# Patient Record
Sex: Male | Born: 1950 | Race: White | Hispanic: No | Marital: Single | Smoking: Current every day smoker
Health system: Southern US, Community
[De-identification: ages and names within clinical notes are randomized; demographics above are authoritative.]

---

## 2021-11-19 ENCOUNTER — Emergency Department: Payer: Medicare Other

## 2021-11-19 ENCOUNTER — Encounter: Payer: Self-pay | Admitting: Emergency Medicine

## 2021-11-19 ENCOUNTER — Other Ambulatory Visit: Payer: Self-pay

## 2021-11-19 ENCOUNTER — Inpatient Hospital Stay
Admission: EM | Admit: 2021-11-19 | Discharge: 2021-11-22 | DRG: 481 | Disposition: A | Discharge status: Skilled Nursing Facility | Payer: Medicare Other | Attending: Internal Medicine | Admitting: Internal Medicine

## 2021-11-19 DIAGNOSIS — F17210 Nicotine dependence, cigarettes, uncomplicated: Secondary | ICD-10-CM | POA: Diagnosis present

## 2021-11-19 DIAGNOSIS — Z01818 Encounter for other preprocedural examination: Secondary | ICD-10-CM | POA: Insufficient documentation

## 2021-11-19 DIAGNOSIS — Y93K1 Activity, walking an animal: Secondary | ICD-10-CM

## 2021-11-19 DIAGNOSIS — D62 Acute posthemorrhagic anemia: Secondary | ICD-10-CM

## 2021-11-19 DIAGNOSIS — W109XXA Fall (on) (from) unspecified stairs and steps, initial encounter: Secondary | ICD-10-CM | POA: Diagnosis present

## 2021-11-19 DIAGNOSIS — Z20822 Contact with and (suspected) exposure to covid-19: Secondary | ICD-10-CM | POA: Diagnosis present

## 2021-11-19 DIAGNOSIS — S72141A Displaced intertrochanteric fracture of right femur, initial encounter for closed fracture: Principal | ICD-10-CM | POA: Diagnosis present

## 2021-11-19 DIAGNOSIS — W19XXXA Unspecified fall, initial encounter: Secondary | ICD-10-CM | POA: Diagnosis not present

## 2021-11-19 DIAGNOSIS — Z419 Encounter for procedure for purposes other than remedying health state, unspecified: Secondary | ICD-10-CM

## 2021-11-19 DIAGNOSIS — F17200 Nicotine dependence, unspecified, uncomplicated: Secondary | ICD-10-CM | POA: Diagnosis not present

## 2021-11-19 DIAGNOSIS — J441 Chronic obstructive pulmonary disease with (acute) exacerbation: Secondary | ICD-10-CM | POA: Diagnosis present

## 2021-11-19 DIAGNOSIS — S72001A Fracture of unspecified part of neck of right femur, initial encounter for closed fracture: Secondary | ICD-10-CM | POA: Diagnosis present

## 2021-11-19 LAB — CBC WITH DIFFERENTIAL/PLATELET
Abs Immature Granulocytes: 0.04 10*3/uL (ref 0.00–0.07)
Basophils Absolute: 0 10*3/uL (ref 0.0–0.1)
Basophils Relative: 0 %
Eosinophils Absolute: 0.1 10*3/uL (ref 0.0–0.5)
Eosinophils Relative: 1 %
HCT: 36.1 % — ABNORMAL LOW (ref 39.0–52.0)
Hemoglobin: 11.9 g/dL — ABNORMAL LOW (ref 13.0–17.0)
Immature Granulocytes: 0 %
Lymphocytes Relative: 8 %
Lymphs Abs: 1.2 10*3/uL (ref 0.7–4.0)
MCH: 30.2 pg (ref 26.0–34.0)
MCHC: 33 g/dL (ref 30.0–36.0)
MCV: 91.6 fL (ref 80.0–100.0)
Monocytes Absolute: 0.8 10*3/uL (ref 0.1–1.0)
Monocytes Relative: 6 %
Neutro Abs: 12.2 10*3/uL — ABNORMAL HIGH (ref 1.7–7.7)
Neutrophils Relative %: 85 %
Platelets: 264 10*3/uL (ref 150–400)
RBC: 3.94 MIL/uL — ABNORMAL LOW (ref 4.22–5.81)
RDW: 14.6 % (ref 11.5–15.5)
WBC: 14.4 10*3/uL — ABNORMAL HIGH (ref 4.0–10.5)
nRBC: 0 % (ref 0.0–0.2)

## 2021-11-19 LAB — BASIC METABOLIC PANEL
Anion gap: 9 (ref 5–15)
BUN: 17 mg/dL (ref 8–23)
CO2: 27 mmol/L (ref 22–32)
Calcium: 8.4 mg/dL — ABNORMAL LOW (ref 8.9–10.3)
Chloride: 101 mmol/L (ref 98–111)
Creatinine, Ser: 0.71 mg/dL (ref 0.61–1.24)
GFR, Estimated: >60 mL/min (ref >60)
Glucose, Bld: 123 mg/dL — ABNORMAL HIGH (ref 70–99)
Potassium: 3.8 mmol/L (ref 3.5–5.1)
Sodium: 137 mmol/L (ref 135–145)

## 2021-11-19 LAB — TYPE AND SCREEN
ABO/RH(D): O NEG
Antibody Screen: NEGATIVE

## 2021-11-19 LAB — RESP PANEL BY RT-PCR (FLU A&B, COVID) ARPGX2
Influenza A by PCR: NEGATIVE
Influenza B by PCR: NEGATIVE
SARS Coronavirus 2 by RT PCR: NEGATIVE

## 2021-11-19 MED ORDER — IPRATROPIUM-ALBUTEROL 0.5-2.5 (3) MG/3ML IN SOLN
3.0000 mL | Freq: Once | RESPIRATORY_TRACT | Status: AC
  Administered 2021-11-19: 3 mL via RESPIRATORY_TRACT
  Filled 2021-11-19: qty 3

## 2021-11-19 MED ORDER — IPRATROPIUM-ALBUTEROL 0.5-2.5 (3) MG/3ML IN SOLN
3.0000 mL | Freq: Once | RESPIRATORY_TRACT | Status: AC
  Administered 2021-11-19: 3 mL via RESPIRATORY_TRACT

## 2021-11-19 MED ORDER — ALBUTEROL SULFATE (2.5 MG/3ML) 0.083% IN NEBU: 2.5000 mg | INHALATION_SOLUTION | RESPIRATORY_TRACT | Status: DC | PRN

## 2021-11-19 MED ORDER — MORPHINE SULFATE (PF) 2 MG/ML IV SOLN: 0.5000 mg | INTRAVENOUS | Status: DC | PRN

## 2021-11-19 MED ORDER — ONDANSETRON HCL 4 MG/2ML IJ SOLN
4.0000 mg | Freq: Once | INTRAMUSCULAR | Status: AC
  Administered 2021-11-19: 4 mg via INTRAVENOUS
  Filled 2021-11-19: qty 2

## 2021-11-19 MED ORDER — TRANEXAMIC ACID-NACL 1000-0.7 MG/100ML-% IV SOLN
1000.0000 mg | Freq: Once | INTRAVENOUS | Status: AC
  Administered 2021-11-19: 1000 mg via INTRAVENOUS
  Filled 2021-11-19: qty 100

## 2021-11-19 MED ORDER — IPRATROPIUM-ALBUTEROL 0.5-2.5 (3) MG/3ML IN SOLN
3.0000 mL | Freq: Once | RESPIRATORY_TRACT | Status: AC
  Administered 2021-11-19: 3 mL via RESPIRATORY_TRACT
  Filled 2021-11-19: qty 6

## 2021-11-19 MED ORDER — MORPHINE SULFATE (PF) 4 MG/ML IV SOLN
4.0000 mg | Freq: Once | INTRAVENOUS | Status: AC
  Administered 2021-11-19: 4 mg via INTRAVENOUS
  Filled 2021-11-19: qty 1

## 2021-11-19 MED ORDER — HYDROCODONE-ACETAMINOPHEN 5-325 MG PO TABS: 1.0000 | ORAL_TABLET | Freq: Four times a day (QID) | ORAL | Status: DC | PRN

## 2021-11-19 NOTE — ED Triage Notes (Addendum)
Pt coming from home via [facility]. Per EMS pt was walking his dog around 5:30 pm. Pt was walking back in his house and dog pulled him down the stairs. Pt states he fell off about 4-5 steps. Pt denies hitting his head. Pt is not taking any blood thinner.  ? ?Pt states he fell on right side of hip. Pt does have some abrasions on right forearm.  ? ?EMS gave pt 73mcg of fentanyl.  ?

## 2021-11-19 NOTE — Progress Notes (Signed)
Full consult note and discussion with patient to follow tomorrow AM.  Called by ED staff. Imaging reviewed.  - Plan for surgery tomorrow, likely afternoon.  - NPO after midnight - Hold anticoagulation - Admit to Hospitalist team.  

## 2021-11-19 NOTE — Assessment & Plan Note (Deleted)
Patient with history of nicotine dependence but no history of CAD, CHF, syncope, arrhythmias, stroke, OSA.  Denies history of COPD though appears to have a wheezing baseline.  Has good exercise tolerance ?EKG and chest x-ray nonacute ?Patient at low risk for perioperative cardiopulmonary complications ?Can proceed with proposed orthopedic repair ?Treat suspected COPD exacerbation to optimize pulmonary status prior to surgery ?

## 2021-11-19 NOTE — Assessment & Plan Note (Addendum)
No prior formal diagnosis of COPD, but had wheezing in ED. This has since resolved. Long term nicotine smoking of course predisposes to COPD, though no significant hyperinflation noted, in addition to no acute findings, on CXR.  ?- Continue inhaled bronchodilators. No indication for steroids currently. ?- Recommend formal PFTs after discharge. ?

## 2021-11-19 NOTE — Assessment & Plan Note (Addendum)
-   s/p IM intertrochanteric nail 3/6 by Dr. Allena Katz.  ?- Postoperative lovenox x4 weeks for VTE ppx, pain control as ordered.  ?- PT/OT formal evaluations > CSW consulted for SNF. ?

## 2021-11-19 NOTE — H&P (Signed)
?History and Physical  ? ? ?Patient: Darren Castillo PYK:[id number] DOB: [birth year] ?DOA: 11/19/2021 ?DOS: the patient was seen and examined on 11/19/2021 ?PCP: Pcp, No  ?Patient coming from: Home ? ?Chief Complaint:  ?Chief Complaint  ?Patient presents with  ? Fall  ? ? ?HPI: Darren Castillo is a 71 y.o. male with medical history significant for Nicotine dependence who presents to the ED following a mechanical fall while walking his dog onto his right hip when his dog suddenly ran off.  He experienced immediate pain in his right hip.  He did not hit his head nor did he lose consciousness.  He denies any other injury.  He was previously in his usual state of health ?ED course: Vitals within normal limits ?Blood work: WBC 14,000 with hemoglobin 11.9 ?COVID and flu negative ?EKG, personally viewed and interpreted: NSR at 87 with no acute ST-T wave changes ?Imaging: CT head and C-spine nonacute.  Hip x-ray with right intratrochanteric femoral fracture.  Chest x-ray nonacute ? ?Patient was noted to be wheezing in the ED which patient reports was his baseline.  He was administered a DuoNeb with improvement.  He denied history of COPD, asthma or inhaler use ? ?The ED provider spoke with orthopedist, Dr. Signa Kell who will take patient to the OR in the a.m.  Hospitalist consulted for admission and preoperative clearance.  ? ?Review of Systems: As mentioned in the history of present illness. All other systems reviewed and are negative. ?No past medical history on file. ?The histories are not reviewed yet. Please review them in the "History" navigator section and refresh this SmartLink. ?Social History:  reports that he has been smoking cigarettes. He has been smoking an average of .5 packs per day. He does not have any smokeless tobacco history on file. No history on file for alcohol use and drug use. ? ?Not on File ? ?No family history on file. ? ?Prior to Admission medications   ?Not on File  ? ? ?Physical Exam: ?Vitals:  ?  11/19/21 1957 11/19/21 2001 11/19/21 2003 11/19/21 2232  ?BP:  130/84  137/76  ?Pulse:  87  97  ?Resp:  20  14  ?Temp:  98 ?F (36.7 ?C)    ?TempSrc:  Oral    ?SpO2: 99% 99%  98%  ?Weight:   63.5 kg   ?Height:   5\' 6"  (1.676 m)   ? ?Physical Exam ?Vitals and nursing note reviewed.  ?Constitutional:   ?   General: He is not in acute distress. ?   Appearance: Normal appearance.  ?HENT:  ?   Head: Normocephalic and atraumatic.  ?Cardiovascular:  ?   Rate and Rhythm: Normal rate and regular rhythm.  ?   Pulses: Normal pulses.  ?   Heart sounds: Normal heart sounds. No murmur heard. ?Pulmonary:  ?   Effort: Pulmonary effort is normal.  ?   Breath sounds: Wheezing present. No rhonchi.  ?   Comments: Scattered wheezes ?Abdominal:  ?   General: Bowel sounds are normal.  ?   Palpations: Abdomen is soft.  ?   Tenderness: There is no abdominal tenderness.  ?Musculoskeletal:     ?   General: No swelling or tenderness. Normal range of motion.  ?   Cervical back: Normal range of motion and neck supple.  ?   Comments: Right hip shortening and external rotation  ?Skin: ?   General: Skin is warm and dry.  ?Neurological:  ?   General: No focal deficit  present.  ?   Mental Status: He is alert. Mental status is at baseline.  ?Psychiatric:     ?   Mood and Affect: Mood normal.     ?   Behavior: Behavior normal.  ? ? ? ?Data Reviewed: ?Relevant notes from primary care and specialist visits, past discharge summaries as available in EHR, including Care Everywhere. ?Prior diagnostic testing as pertinent to current admission diagnoses ?Updated medications and problem lists for reconciliation ?ED course, including vitals, labs, imaging, treatment and response to treatment ?Triage notes, nursing and pharmacy notes and ED provider's notes ?Notable results as noted in HPI ? ? ?Assessment and Plan: ?* Closed right hip fracture, initial encounter (HCC) ?Resulting from accidental fall ?Dr. Allena Katz will take patient to the OR in the a.m. ?N.p.o. from  midnight.  SCDs for DVT prophylaxis ?Further orders per Dr. Allena Katz ? ?COPD with acute exacerbation (HCC) ?No prior formal diagnosis of COPD, but patient wheezing on exam ?Mild exacerbation ?DuoNebs every 6 and as needed ? ?Preoperative clearance ?Patient with history of nicotine dependence but no history of CAD, CHF, syncope, arrhythmias, stroke, OSA.  Denies history of COPD though appears to have a wheezing baseline.  Has good exercise tolerance ?EKG and chest x-ray nonacute ?Patient at low risk for perioperative cardiopulmonary complications ?Can proceed with proposed orthopedic repair ?Treat suspected COPD exacerbation to optimize pulmonary status prior to surgery ? ?Tobacco use disorder ?Declines nicotine patch ?Advised on quitting ? ? ? ? ? ? ?Advance Care Planning:   Code Status: Not on file full ? ?Consults: Orthopedics, Dr. Signa Kell ? ?Family Communication: none ? ?Severity of Illness: ?The appropriate patient status for this patient is INPATIENT. Inpatient status is judged to be reasonable and necessary in order to provide the required intensity of service to ensure the patient's safety. The patient's presenting symptoms, physical exam findings, and initial radiographic and laboratory data in the context of their chronic comorbidities is felt to place them at high risk for further clinical deterioration. Furthermore, it is not anticipated that the patient will be medically stable for discharge from the hospital within 2 midnights of admission.  ? ?* I certify that at the point of admission it is my clinical judgment that the patient will require inpatient hospital care spanning beyond 2 midnights from the point of admission due to high intensity of service, high risk for further deterioration and high frequency of surveillance required.* ? ?Author: ?Andris Baumann, MD ?11/19/2021 10:48 PM ? ?For on call review [email or web address].  ?

## 2021-11-19 NOTE — Assessment & Plan Note (Addendum)
-   Cessation counseling provided. 

## 2021-11-19 NOTE — ED Provider Notes (Signed)
? ?[facility] ?Provider Note ? ? ? Event Date/Time  ? First MD Initiated Contact with Patient 11/19/21 2014   ?  (approximate) ? ? ?History  ? ?Fall ? ? ?HPI ? ?Darren Castillo is a 71 y.o. male here with fall.  Patient states he lives alone.  States he was taking his dog and stairs today when the dog ran, causing a fall.  He landed onto his right hip.  Reports he has been unable to ambulate since then.  The pain is aching, throbbing, worse with any kind of movement.  No history of previous injuries here.  Other than that, patient denies any direct trauma.  He is not on blood thinners.  Denies any headache.  No focal numbness or weakness.  Denies any distal numbness or tingling of the leg. ?  ? ? ?Physical Exam  ? ?Triage Vital Signs: ?ED Triage Vitals  ?Enc Vitals Group  ?   BP 11/19/21 2001 130/84  ?   Pulse Rate 11/19/21 2001 87  ?   Resp 11/19/21 2001 20  ?   Temp 11/19/21 2001 98 ?F (36.7 ?C)  ?   Temp Source 11/19/21 2001 Oral  ?   SpO2 11/19/21 1957 99 %  ?   Weight 11/19/21 2003 140 lb (63.5 kg)  ?   Height 11/19/21 2003 5\' 6"  (1.676 m)  ?   Head Circumference --   ?   Peak Flow --   ?   Pain Score 11/19/21 2002 7  ?   Pain Loc --   ?   Pain Edu? --   ?   Excl. in [place]? --   ? ? ?Most recent vital signs: ?Vitals:  ? 11/20/21 0000 11/20/21 0100  ?BP: 129/83 112/69  ?Pulse: (!) 104 95  ?Resp: 20 15  ?Temp:    ?SpO2: 93% 91%  ? ? ? ?General: Awake, no distress.  ?CV:  Good peripheral perfusion.  ?Resp:  Normal effort.  ?Abd:  No distention.  ?Other:  Right lower extremity shortened and externally rotated.  Distal neurovasculature is intact. ? ? ?ED Results / Procedures / Treatments  ? ?Labs ?(all labs ordered are listed, but only abnormal results are displayed) ?Labs Reviewed  ?CBC WITH DIFFERENTIAL/PLATELET - Abnormal; Notable for the following components:  ?    Result Value  ? WBC 14.4 (*)   ? RBC 3.94 (*)   ? Hemoglobin 11.9 (*)   ? HCT 36.1 (*)   ? Neutro Abs 12.2 (*)   ? All other  components within normal limits  ?BASIC METABOLIC PANEL - Abnormal; Notable for the following components:  ? Glucose, Bld 123 (*)   ? Calcium 8.4 (*)   ? All other components within normal limits  ?RESP PANEL BY RT-PCR (FLU A&B, COVID) ARPGX2  ?HIV ANTIBODY (ROUTINE TESTING W REFLEX)  ?TYPE AND SCREEN  ? ? ? ?EKG ?Normal sinus rhythm, ventricular rate 87.  PR 122, QRS 72, QTc 423.  No acute ST elevations or depressions.  No EKG evidence of acute ischemia or infarct. ? ? ?RADIOLOGY ?Chest x-ray: No acute abnormality ?DG hip: Right intertrochanteric hip fracture ?CT head/C-spine: No acute intracranial abnormality ? ? ?I also independently reviewed and agree wit radiologist interpretations. ? ? ?PROCEDURES: ? ?Critical Care performed: No ? ? ? ?MEDICATIONS ORDERED IN ED: ?Medications  ?HYDROcodone-acetaminophen (NORCO/VICODIN) 5-325 MG per tablet 1-2 tablet (has no administration in time range)  ?morphine (PF) 2 MG/ML injection 0.5 mg (has no  administration in time range)  ?albuterol (PROVENTIL) (2.5 MG/3ML) 0.083% nebulizer solution 2.5 mg (has no administration in time range)  ?ipratropium-albuterol (DUONEB) 0.5-2.5 (3) MG/3ML nebulizer solution 3 mL (3 mLs Nebulization Not Given 11/20/21 0134)  ?ipratropium-albuterol (DUONEB) 0.5-2.5 (3) MG/3ML nebulizer solution 3 mL (3 mLs Nebulization Given 11/19/21 2042)  ?morphine (PF) 4 MG/ML injection 4 mg (4 mg Intravenous Given 11/19/21 2042)  ?ondansetron [facility]) injection 4 mg (4 mg Intravenous Given 11/19/21 2042)  ?tranexamic acid (CYKLOKAPRON) IVPB 1,000 mg (0 mg Intravenous Stopped 11/19/21 2302)  ?ipratropium-albuterol (DUONEB) 0.5-2.5 (3) MG/3ML nebulizer solution 3 mL (3 mLs Nebulization Given 11/19/21 2303)  ?ipratropium-albuterol (DUONEB) 0.5-2.5 (3) MG/3ML nebulizer solution 3 mL (3 mLs Nebulization Given 11/19/21 2303)  ? ? ? ?IMPRESSION / MDM / ASSESSMENT AND PLAN / ED COURSE  ?I reviewed the triage vital signs and the nursing notes. ?             ?               ? ? ?The  patient is on the cardiac monitor to evaluate for evidence of arrhythmia and/or significant heart rate changes. ? ? ?MDM:  ?71 yo M with no reported PMHx here with hip pain after mechanical fall. Re: fall - imaging shows R intratroch fx. Dr. Posey Pronto with Ortho consulted and will see pt. CT Head/ C spine negative. Pt states he was otherwise well, denies recent illness. He has a significant wheeze on exam, however, and reports a long h/o smoking. Suspect underlying COPD. Breathing tx given. Admit to medicine.  ? ? ?MEDICATIONS GIVEN IN ED: ?Medications  ?HYDROcodone-acetaminophen (NORCO/VICODIN) 5-325 MG per tablet 1-2 tablet (has no administration in time range)  ?morphine (PF) 2 MG/ML injection 0.5 mg (has no administration in time range)  ?albuterol (PROVENTIL) (2.5 MG/3ML) 0.083% nebulizer solution 2.5 mg (has no administration in time range)  ?ipratropium-albuterol (DUONEB) 0.5-2.5 (3) MG/3ML nebulizer solution 3 mL (3 mLs Nebulization Not Given 11/20/21 0134)  ?ipratropium-albuterol (DUONEB) 0.5-2.5 (3) MG/3ML nebulizer solution 3 mL (3 mLs Nebulization Given 11/19/21 2042)  ?morphine (PF) 4 MG/ML injection 4 mg (4 mg Intravenous Given 11/19/21 2042)  ?ondansetron [facility]) injection 4 mg (4 mg Intravenous Given 11/19/21 2042)  ?tranexamic acid (CYKLOKAPRON) IVPB 1,000 mg (0 mg Intravenous Stopped 11/19/21 2302)  ?ipratropium-albuterol (DUONEB) 0.5-2.5 (3) MG/3ML nebulizer solution 3 mL (3 mLs Nebulization Given 11/19/21 2303)  ?ipratropium-albuterol (DUONEB) 0.5-2.5 (3) MG/3ML nebulizer solution 3 mL (3 mLs Nebulization Given 11/19/21 2303)  ? ? ? ?Consults:  ?Dr. Posey Pronto, orthopedics ? ? ?EMR reviewed  ?None available ? ? ? ? ?FINAL CLINICAL IMPRESSION(S) / ED DIAGNOSES  ? ?Final diagnoses:  ?Closed fracture of right hip, initial encounter ([place])  ? ? ? ?Rx / DC Orders  ? ?ED Discharge Orders   ? ? None  ? ?  ? ? ? ?Note:  This document was prepared using Dragon voice recognition software and may include unintentional dictation  errors. ?  ?Duffy Bruce, MD ?11/20/21 0143 ? ?

## 2021-11-20 ENCOUNTER — Other Ambulatory Visit: Payer: Self-pay

## 2021-11-20 ENCOUNTER — Encounter
Admission: EM | Disposition: A | Discharge status: Skilled Nursing Facility | Payer: Self-pay | Source: Home / Self Care | Attending: Family Medicine

## 2021-11-20 ENCOUNTER — Inpatient Hospital Stay: Payer: Medicare Other

## 2021-11-20 ENCOUNTER — Encounter: Payer: Self-pay | Admitting: Internal Medicine

## 2021-11-20 ENCOUNTER — Inpatient Hospital Stay: Payer: Medicare Other | Admitting: Anesthesiology

## 2021-11-20 DIAGNOSIS — F17200 Nicotine dependence, unspecified, uncomplicated: Secondary | ICD-10-CM

## 2021-11-20 DIAGNOSIS — W19XXXA Unspecified fall, initial encounter: Secondary | ICD-10-CM

## 2021-11-20 DIAGNOSIS — J441 Chronic obstructive pulmonary disease with (acute) exacerbation: Secondary | ICD-10-CM

## 2021-11-20 HISTORY — PX: INTRAMEDULLARY (IM) NAIL INTERTROCHANTERIC: SHX5875

## 2021-11-20 SURGERY — FIXATION, FRACTURE, INTERTROCHANTERIC, WITH INTRAMEDULLARY ROD
Anesthesia: Spinal | Site: Hip | Laterality: Right

## 2021-11-20 MED ORDER — TRANEXAMIC ACID-NACL 1000-0.7 MG/100ML-% IV SOLN
INTRAVENOUS | Status: DC | PRN
  Administered 2021-11-20: 1000 mg via INTRAVENOUS

## 2021-11-20 MED ORDER — ONDANSETRON HCL 4 MG PO TABS: 4.0000 mg | ORAL_TABLET | Freq: Four times a day (QID) | ORAL | Status: DC | PRN

## 2021-11-20 MED ORDER — BUPIVACAINE HCL (PF) 0.5 % IJ SOLN
INTRAMUSCULAR | Status: DC | PRN
  Administered 2021-11-20: 2.5 mL

## 2021-11-20 MED ORDER — ENSURE ENLIVE PO LIQD
237.0000 mL | Freq: Two times a day (BID) | ORAL | Status: DC
  Filled 2021-11-20 (×4): qty 237

## 2021-11-20 MED ORDER — FENTANYL CITRATE (PF) 100 MCG/2ML IJ SOLN
INTRAMUSCULAR | Status: DC | PRN
  Administered 2021-11-20 (×2): 50 ug via INTRAVENOUS

## 2021-11-20 MED ORDER — METHOCARBAMOL 1000 MG/10ML IJ SOLN
500.0000 mg | Freq: Four times a day (QID) | INTRAVENOUS | Status: DC | PRN
  Filled 2021-11-20: qty 5

## 2021-11-20 MED ORDER — FLEET ENEMA 7-19 GM/118ML RE ENEM: 1.0000 | ENEMA | Freq: Once | RECTAL | Status: DC | PRN

## 2021-11-20 MED ORDER — TRAMADOL HCL 50 MG PO TABS: 50.0000 mg | ORAL_TABLET | Freq: Four times a day (QID) | ORAL | Status: DC | PRN

## 2021-11-20 MED ORDER — BUPIVACAINE HCL (PF) 0.5 % IJ SOLN
INTRAMUSCULAR | Status: AC
  Filled 2021-11-20: qty 30

## 2021-11-20 MED ORDER — ACETAMINOPHEN 10 MG/ML IV SOLN
INTRAVENOUS | Status: DC | PRN
  Administered 2021-11-20: 1000 mg via INTRAVENOUS

## 2021-11-20 MED ORDER — ADULT MULTIVITAMIN W/MINERALS CH
1.0000 | ORAL_TABLET | Freq: Every day | ORAL | Status: DC
  Administered 2021-11-21 – 2021-11-22 (×2): 1 via ORAL
  Filled 2021-11-20 (×2): qty 1

## 2021-11-20 MED ORDER — OXYCODONE HCL 5 MG PO TABS
5.0000 mg | ORAL_TABLET | ORAL | Status: DC | PRN
  Administered 2021-11-21: 5 mg via ORAL
  Filled 2021-11-20: qty 1

## 2021-11-20 MED ORDER — FENTANYL CITRATE (PF) 100 MCG/2ML IJ SOLN
INTRAMUSCULAR | Status: AC
  Filled 2021-11-20: qty 2

## 2021-11-20 MED ORDER — DOCUSATE SODIUM 100 MG PO CAPS
100.0000 mg | ORAL_CAPSULE | Freq: Two times a day (BID) | ORAL | Status: DC
  Administered 2021-11-20 – 2021-11-22 (×4): 100 mg via ORAL
  Filled 2021-11-20 (×4): qty 1

## 2021-11-20 MED ORDER — NEOMYCIN-POLYMYXIN B GU 40-200000 IR SOLN
Status: AC
  Filled 2021-11-20: qty 4

## 2021-11-20 MED ORDER — SODIUM CHLORIDE 0.9 % IV SOLN: INTRAVENOUS | Status: DC

## 2021-11-20 MED ORDER — OXYCODONE HCL 5 MG PO TABS: 2.5000 mg | ORAL_TABLET | ORAL | Status: DC | PRN

## 2021-11-20 MED ORDER — PHENYLEPHRINE HCL-NACL 20-0.9 MG/250ML-% IV SOLN
INTRAVENOUS | Status: AC
  Filled 2021-11-20: qty 250

## 2021-11-20 MED ORDER — LACTATED RINGERS IV SOLN
Freq: Once | INTRAVENOUS | Status: AC
Start: 2021-11-20 — End: 2021-11-20

## 2021-11-20 MED ORDER — CEFAZOLIN SODIUM-DEXTROSE 2-4 GM/100ML-% IV SOLN
2.0000 g | Freq: Three times a day (TID) | INTRAVENOUS | Status: AC
  Administered 2021-11-20 – 2021-11-21 (×3): 2 g via INTRAVENOUS
  Filled 2021-11-20 (×3): qty 100

## 2021-11-20 MED ORDER — LACTATED RINGERS IV SOLN: INTRAVENOUS | Status: DC | PRN

## 2021-11-20 MED ORDER — METOCLOPRAMIDE HCL 10 MG PO TABS: 5.0000 mg | ORAL_TABLET | Freq: Three times a day (TID) | ORAL | Status: DC | PRN

## 2021-11-20 MED ORDER — LIDOCAINE HCL (PF) 2 % IJ SOLN
INTRAMUSCULAR | Status: AC
  Filled 2021-11-20: qty 5

## 2021-11-20 MED ORDER — FENTANYL CITRATE (PF) 100 MCG/2ML IJ SOLN: 25.0000 ug | INTRAMUSCULAR | Status: DC | PRN

## 2021-11-20 MED ORDER — NEOMYCIN-POLYMYXIN B GU 40-200000 IR SOLN
Status: DC | PRN
  Administered 2021-11-20: 4 mL

## 2021-11-20 MED ORDER — KETOROLAC TROMETHAMINE 15 MG/ML IJ SOLN
7.5000 mg | Freq: Four times a day (QID) | INTRAMUSCULAR | Status: AC
  Administered 2021-11-20 – 2021-11-21 (×4): 7.5 mg via INTRAVENOUS
  Filled 2021-11-20 (×4): qty 1

## 2021-11-20 MED ORDER — METHOCARBAMOL 500 MG PO TABS: 500.0000 mg | ORAL_TABLET | Freq: Four times a day (QID) | ORAL | Status: DC | PRN

## 2021-11-20 MED ORDER — BUPIVACAINE IN DEXTROSE 0.75-8.25 % IT SOLN: INTRATHECAL | Status: DC | PRN

## 2021-11-20 MED ORDER — SENNOSIDES-DOCUSATE SODIUM 8.6-50 MG PO TABS: 1.0000 | ORAL_TABLET | Freq: Every evening | ORAL | Status: DC | PRN

## 2021-11-20 MED ORDER — PROPOFOL 500 MG/50ML IV EMUL
INTRAVENOUS | Status: DC | PRN
  Administered 2021-11-20: 50 ug/kg/min via INTRAVENOUS

## 2021-11-20 MED ORDER — CEFAZOLIN SODIUM-DEXTROSE 2-4 GM/100ML-% IV SOLN
INTRAVENOUS | Status: AC
  Filled 2021-11-20: qty 100

## 2021-11-20 MED ORDER — IPRATROPIUM-ALBUTEROL 0.5-2.5 (3) MG/3ML IN SOLN
3.0000 mL | Freq: Four times a day (QID) | RESPIRATORY_TRACT | Status: DC
  Administered 2021-11-20 (×2): 3 mL via RESPIRATORY_TRACT
  Filled 2021-11-20 (×3): qty 3

## 2021-11-20 MED ORDER — BUPIVACAINE LIPOSOME 1.3 % IJ SUSP
INTRAMUSCULAR | Status: DC | PRN
  Administered 2021-11-20: 20 mL

## 2021-11-20 MED ORDER — BUPIVACAINE HCL (PF) 0.5 % IJ SOLN
INTRAMUSCULAR | Status: DC | PRN
  Administered 2021-11-20: 30 mL

## 2021-11-20 MED ORDER — PHENYLEPHRINE HCL-NACL 20-0.9 MG/250ML-% IV SOLN
INTRAVENOUS | Status: DC | PRN
  Administered 2021-11-20: 35 ug/min via INTRAVENOUS

## 2021-11-20 MED ORDER — PHENYLEPHRINE 40 MCG/ML (10ML) SYRINGE FOR IV PUSH (FOR BLOOD PRESSURE SUPPORT)
PREFILLED_SYRINGE | INTRAVENOUS | Status: AC
Start: 2021-11-20 — End: ?
  Filled 2021-11-20: qty 10

## 2021-11-20 MED ORDER — IPRATROPIUM-ALBUTEROL 0.5-2.5 (3) MG/3ML IN SOLN
3.0000 mL | Freq: Three times a day (TID) | RESPIRATORY_TRACT | Status: DC
  Administered 2021-11-21: 3 mL via RESPIRATORY_TRACT
  Filled 2021-11-20: qty 3

## 2021-11-20 MED ORDER — ACETAMINOPHEN 500 MG PO TABS
1000.0000 mg | ORAL_TABLET | Freq: Three times a day (TID) | ORAL | Status: DC
  Administered 2021-11-20 – 2021-11-22 (×5): 1000 mg via ORAL
  Filled 2021-11-20 (×5): qty 2

## 2021-11-20 MED ORDER — PROPOFOL 500 MG/50ML IV EMUL
INTRAVENOUS | Status: AC
Start: 2021-11-20 — End: ?
  Filled 2021-11-20: qty 50

## 2021-11-20 MED ORDER — TRANEXAMIC ACID-NACL 1000-0.7 MG/100ML-% IV SOLN
INTRAVENOUS | Status: AC
  Filled 2021-11-20: qty 100

## 2021-11-20 MED ORDER — METOCLOPRAMIDE HCL 5 MG/ML IJ SOLN: 5.0000 mg | Freq: Three times a day (TID) | INTRAMUSCULAR | Status: DC | PRN

## 2021-11-20 MED ORDER — PHENYLEPHRINE HCL (PRESSORS) 10 MG/ML IV SOLN
INTRAVENOUS | Status: DC | PRN
  Administered 2021-11-20 (×5): 80 ug via INTRAVENOUS

## 2021-11-20 MED ORDER — HYDROMORPHONE HCL 1 MG/ML IJ SOLN: 0.2000 mg | INTRAMUSCULAR | Status: DC | PRN

## 2021-11-20 MED ORDER — ENOXAPARIN SODIUM 40 MG/0.4ML IJ SOSY
40.0000 mg | PREFILLED_SYRINGE | INTRAMUSCULAR | Status: DC
  Administered 2021-11-21 – 2021-11-22 (×2): 40 mg via SUBCUTANEOUS
  Filled 2021-11-20 (×2): qty 0.4

## 2021-11-20 MED ORDER — ONDANSETRON HCL 4 MG/2ML IJ SOLN: 4.0000 mg | Freq: Four times a day (QID) | INTRAMUSCULAR | Status: DC | PRN

## 2021-11-20 MED ORDER — MUPIROCIN 2 % EX OINT
1.0000 "application " | TOPICAL_OINTMENT | Freq: Two times a day (BID) | CUTANEOUS | Status: DC
  Filled 2021-11-20: qty 22

## 2021-11-20 MED ORDER — CEFAZOLIN SODIUM-DEXTROSE 2-4 GM/100ML-% IV SOLN
2.0000 g | Freq: Once | INTRAVENOUS | Status: AC
  Administered 2021-11-20: 2 g via INTRAVENOUS

## 2021-11-20 MED ORDER — BUPIVACAINE LIPOSOME 1.3 % IJ SUSP
INTRAMUSCULAR | Status: AC
  Filled 2021-11-20: qty 20

## 2021-11-20 MED ORDER — ACETAMINOPHEN 10 MG/ML IV SOLN
INTRAVENOUS | Status: AC
  Filled 2021-11-20: qty 100

## 2021-11-20 MED ORDER — BISACODYL 10 MG RE SUPP: 10.0000 mg | Freq: Every day | RECTAL | Status: DC | PRN

## 2021-11-20 MED ORDER — 0.9 % SODIUM CHLORIDE (POUR BTL) OPTIME
TOPICAL | Status: DC | PRN
  Administered 2021-11-20: 1000 mL

## 2021-11-20 SURGICAL SUPPLY — 46 items
BIT DRILL INTERTAN LAG SCREW (BIT) ×2 IMPLANT
BIT DRILL LONG 4.0 (BIT) IMPLANT
BLADE SURG 15 STRL LF DISP TIS (BLADE) ×1 IMPLANT
BLADE SURG 15 STRL SS (BLADE) ×2
CHLORAPREP W/TINT 26 (MISCELLANEOUS) ×3 IMPLANT
DRAPE 3/4 80X56 (DRAPES) ×3 IMPLANT
DRAPE SURG 17X11 SM STRL (DRAPES) ×6 IMPLANT
DRAPE U-SHAPE 47X51 STRL (DRAPES) ×6 IMPLANT
DRILL BIT LONG 4.0 (BIT) ×3
DRSG OPSITE POSTOP 3X4 (GAUZE/BANDAGES/DRESSINGS) ×9 IMPLANT
ELECT REM PT RETURN 9FT ADLT (ELECTROSURGICAL) ×3
ELECTRODE REM PT RTRN 9FT ADLT (ELECTROSURGICAL) ×1 IMPLANT
GLOVE SRG 8 PF TXTR STRL LF DI (GLOVE) ×1 IMPLANT
GLOVE SURG SYN 7.5  E (GLOVE) ×2
GLOVE SURG SYN 7.5 E (GLOVE) ×1 IMPLANT
GLOVE SURG SYN 7.5 PF PI (GLOVE) ×1 IMPLANT
GLOVE SURG UNDER POLY LF SZ8 (GLOVE) ×2
GOWN STRL REUS W/ TWL LRG LVL3 (GOWN DISPOSABLE) ×1 IMPLANT
GOWN STRL REUS W/ TWL XL LVL3 (GOWN DISPOSABLE) ×1 IMPLANT
GOWN STRL REUS W/TWL LRG LVL3 (GOWN DISPOSABLE) ×2
GOWN STRL REUS W/TWL XL LVL3 (GOWN DISPOSABLE) ×2
GUIDE PIN 3.2X343 (PIN) ×2
GUIDE PIN 3.2X343MM (PIN) ×4
KIT PATIENT CARE HANA TABLE (KITS) ×3 IMPLANT
KIT TURNOVER KIT A (KITS) ×3 IMPLANT
MANIFOLD NEPTUNE II (INSTRUMENTS) ×3 IMPLANT
MAT ABSORB  FLUID 56X50 GRAY (MISCELLANEOUS) ×4
MAT ABSORB FLUID 56X50 GRAY (MISCELLANEOUS) ×2 IMPLANT
NAIL TRIGEN INTERTAN 10X18CM (Nail) ×2 IMPLANT
NDL FILTER BLUNT 18X1 1/2 (NEEDLE) ×1 IMPLANT
NEEDLE FILTER BLUNT 18X 1/2SAF (NEEDLE) ×2
NEEDLE FILTER BLUNT 18X1 1/2 (NEEDLE) ×1 IMPLANT
NEEDLE HYPO 22GX1.5 SAFETY (NEEDLE) ×3 IMPLANT
NS IRRIG 1000ML POUR BTL (IV SOLUTION) ×3 IMPLANT
PACK HIP COMPR (MISCELLANEOUS) ×3 IMPLANT
PENCIL ELECTRO HAND CTR (MISCELLANEOUS) ×3 IMPLANT
PIN GUIDE 3.2X343MM (PIN) IMPLANT
SCREW LAG COMPR KIT 95/90 (Screw) ×2 IMPLANT
SCREW TRIGEN LOW PROF 5.0X35 (Screw) ×2 IMPLANT
STAPLER SKIN PROX 35W (STAPLE) ×3 IMPLANT
SUT VIC AB 0 CT1 36 (SUTURE) ×2 IMPLANT
SUT VIC AB 2-0 CT2 27 (SUTURE) ×3 IMPLANT
SYR 10ML LL (SYRINGE) ×3 IMPLANT
SYR 30ML LL (SYRINGE) ×3 IMPLANT
TAPE CLOTH 3X10 WHT NS LF (GAUZE/BANDAGES/DRESSINGS) ×6 IMPLANT
WATER STERILE IRR 500ML POUR (IV SOLUTION) ×3 IMPLANT

## 2021-11-20 NOTE — Op Note (Signed)
DATE OF SURGERY: 11/20/2021 ? ?PREOPERATIVE DIAGNOSIS: Right intertrochanteric hip fracture ? ?POSTOPERATIVE DIAGNOSIS: Right intertrochanteric hip fracture ? ?PROCEDURE: Intramedullary nailing of Right femur with cephalomedullary device ? ?SURGEON: Rosealee Albee, MD ? ?ANESTHESIA: spinal ? ?EBL: 50 cc ? ?IVF: per anesthesia record ? ?COMPONENTS:  ?[employer] Trigen Intertan Short Nail: 10x182mm; lag screw with 77mm compression screw; 5x 48mm distal cortical interlocking screw ? ?INDICATIONS: ?Darren Castillo is a 71 y.o. male who sustained an intertrochanteric fracture after a fall. Risks and benefits of intramedullary nailing were explained to the patient and/or family . Risks include but are not limited to bleeding, infection, injury to tissues, nerves, vessels, nonunion/malunion, hardware failure, limb length discrepancy/hip rotation mismatch and risks of anesthesia. The patient and/or family understand these risks, have completed an informed consent, and wish to proceed. ?  ?PROCEDURE:  ?The patient was brought into the operating room. After administering anesthesia, the patient was placed in the supine position on the Hana table. The uninjured leg was placed in an extended position while the injured lower extremity was placed in longitudinal traction. The fracture was reduced using longitudinal traction and internal rotation. The adequacy of reduction was verified fluoroscopically in AP and lateral projections and found to be almost anatomic. The lateral aspect of the hip and thigh were prepped with ChloraPrep solution before being draped sterilely. Preoperative IV antibiotics were administered. A timeout was performed to verify the appropriate surgical site, patient, and procedure.  ?  ?The greater trochanter was identified and an approximately 6 cm incision was made about 3 fingerbreadths above the tip of the greater trochanter. The incision was carried down through the subcutaneous tissues to expose  the gluteal fascia. This was split the length of the incision, providing access to the tip of the trochanter. Under fluoroscopic guidance, a guidewire was drilled through the tip of the trochanter into the proximal metaphysis to the level of the lesser trochanter. After verifying its position fluoroscopically in AP and lateral projections, it was overreamed with the opening reamer to the level of the lesser trochanter. The nail was selected and advanced to the appropriate depth as verified fluoroscopically.  ?  ?The guide system for the lag screw was positioned and advanced through an approximately 5cm incision over the lateral aspect of the proximal femur. The guidewire was drilled up through the femoral nail and into the femoral neck to rest within 5 mm of subchondral bone. After verifying its position in the femoral neck and head in both AP and lateral projections, the guidewire was measured and appropriate sized lag screw was selected.  The channel for the compression screw was drilled and antirotation bar was placed.  Lag screw was drilled and placed in appropriate position.  Compression screw was then placed.  Appropriate compression was achieved.  The set screw was locked in place. Again, the adequacy of hardware position and fracture reduction was verified fluoroscopically in AP and lateral projections. ?  ?Attention was then turned to the distal interlocking screw in the diaphysis. Using a targeted assembly, a stab incision was made and hole was drilled through the nail. An interlocking screw was placed with excellent purchase.  Appropriate screw position was verified fluoroscopically in AP and lateral projections. ?  ?The wounds were irrigated thoroughly with sterile saline solution. Local anesthetic was injected into the wounds. Deep fascia was closed with 0-Vicryl. The subcutaneous tissues were closed using 2-0 Vicryl interrupted sutures. The skin was closed using staples. Sterile occlusive dressings  were applied to all wounds. The patient was then transferred to the recovery room in satisfactory condition. ?  ?POSTOPERATIVE PLAN: ?The patient will be WBAT on the operative extremity. Lovenox 40mg /day x 4 weeks to start on POD#1. Perioperative IV antibiotics x 24 hours. PT/OT on POD#1.  ? ?

## 2021-11-20 NOTE — Progress Notes (Signed)
Bladder scanned for 228cc

## 2021-11-20 NOTE — Transfer of Care (Signed)
Immediate Anesthesia Transfer of Care Note ? ?Patient: Darren Castillo ? ?Procedure(s) Performed: INTRAMEDULLARY (IM) NAIL INTERTROCHANTRIC (Right: Hip) ? ?Patient Location: PACU ? ?Anesthesia Type:Spinal ? ?Level of Consciousness: awake, drowsy and patient cooperative ? ?Airway & Oxygen Therapy: Patient Spontanous Breathing ? ?Post-op Assessment: Report given to RN and Post -op Vital signs reviewed and stable ? ?Post vital signs: Reviewed and stable ? ?Last Vitals:  ?Vitals Value Taken Time  ?BP    ?Temp    ?Pulse    ?Resp    ?SpO2    ? ? ?Last Pain:  ?Vitals:  ? 11/20/21 1245  ?TempSrc: Temporal  ?PainSc: 4   ?   ? ?Patients Stated Pain Goal: 0 (11/20/21 1245) ? ?Complications: No notable events documented. ?

## 2021-11-20 NOTE — Anesthesia Procedure Notes (Addendum)
Spinal ? ?Patient location during procedure: OR ?Start time: 11/20/2021 1:29 PM ?End time: 11/20/2021 1:35 PM ?Reason for block: surgical anesthesia ?Staffing ?Performed: resident/CRNA  ?Resident/CRNA: Demetrius Charity, CRNA ?Preanesthetic Checklist ?Completed: patient identified, IV checked, site marked, risks and benefits discussed, surgical consent, monitors and equipment checked, pre-op evaluation and timeout performed ?Spinal Block ?Patient position: left lateral decubitus ?Prep: ChloraPrep ?Patient monitoring: heart rate, continuous pulse ox, blood pressure and cardiac monitor ?Approach: midline ?Location: L3-4 ?Injection technique: single-shot ?Needle ?Needle type: Whitacre and Introducer  ?Needle gauge: 25 G ?Needle length: 9 cm ?Assessment ?Sensory level: T10 ?Events: CSF return ?Additional Notes ?Sterile aseptic technique used throughout the procedure.  Negative paresthesia. Negative blood return. Positive free-flowing CSF. Expiration date of kit checked and confirmed. Patient tolerated procedure well, without complications. ? ? ? ? ? ?

## 2021-11-20 NOTE — Progress Notes (Signed)
Arrived to unit from PACU, bedside handoff completed with [facility].  ?

## 2021-11-20 NOTE — Plan of Care (Signed)

## 2021-11-20 NOTE — Progress Notes (Signed)
PT Cancellation Note ? ?Patient Details ?Name: Renn Dirocco ?MRN: [id number] ?DOB: 17-Oct-1950 ? ? ?Cancelled Treatment:    Reason Eval/Treat Not Completed: Medical issues which prohibited therapy (Consult received and chart reviewed. Patient currently off unit for surgical repair of acute hip fracture.  Will require new orders post-procedure. Please re-consult as medically appropiate.) ? ?Verity Gilcrest H. Manson Passey, PT, DPT, NCS ?11/20/21, 1:28 PM ?[phone] ? ?

## 2021-11-20 NOTE — Consult Note (Signed)
ORTHOPAEDIC CONSULTATION  REQUESTING PHYSICIAN: Patrecia Pour, MD  Chief Complaint:   R hip pain  History of Present Illness: Darren Castillo is a 71 y.o. male who had a fall at home after his dog ran off.  The patient noted immediate hip pain and inability to ambulate.  The patient ambulates unassisted at baseline.  The patient lives at home by himself. Pain is described as sharp at its worst and a dull ache at its best.  Pain is rated a 10 out of 10 in severity.  Pain is improved with rest and immobilization.  Pain is worse with any sort of movement.  X-rays in the emergency department show a right intertrochanteric hip fracture.  He denies any significant medical history as he has not seen a doctor in many years. No anticoagulation. He states he smokes ~1/3ppd cigarettes.   History reviewed. No pertinent past medical history. History reviewed. No pertinent surgical history. Social History   Socioeconomic History   Marital status: Single    Spouse name: Not on file   Number of children: Not on file   Years of education: Not on file   Highest education level: Not on file  Occupational History   Not on file  Tobacco Use   Smoking status: Every Day    Packs/day: 0.50    Types: Cigarettes   Smokeless tobacco: Not on file  Substance and Sexual Activity   Alcohol use: Not on file   Drug use: Not on file   Sexual activity: Not on file  Other Topics Concern   Not on file  Social History Narrative   Not on file   Social Determinants of Health   Financial Resource Strain: Not on file  Food Insecurity: Not on file  Transportation Needs: Not on file  Physical Activity: Not on file  Stress: Not on file  Social Connections: Not on file   History reviewed. No pertinent family history. No Known Allergies Prior to Admission medications   Medication Sig Start Date End Date Taking? Authorizing Provider  metoprolol  tartrate (LOPRESSOR) 25 MG tablet Take 12.5 mg by mouth 2 (two) times daily. Patient not taking: Reported on 11/20/2021 03/17/18   [provider]   Recent Labs    11/19/21 2008  WBC 14.4*  HGB 11.9*  HCT 36.1*  PLT 264  K 3.8  CL 101  CO2 27  BUN 17  CREATININE 0.71  GLUCOSE 123*  CALCIUM 8.4*   CT HEAD WO CONTRAST (5MM)  Result Date: 11/19/2021 CLINICAL DATA:  Head trauma, moderate-severe Got pulled down stairs by dog, fall down steps. EXAM: CT HEAD WITHOUT CONTRAST TECHNIQUE: Contiguous axial images were obtained from the base of the skull through the vertex without intravenous contrast. RADIATION DOSE REDUCTION: This exam was performed according to the departmental dose-optimization program which includes automated exposure control, adjustment of the mA and/or kV according to patient size and/or use of iterative reconstruction technique. COMPARISON:  None. FINDINGS: Brain: No intracranial hemorrhage, mass effect, or midline shift. Age related atrophy. No hydrocephalus. The basilar cisterns are patent. There is moderate periventricular and deep white matter hypodensity typical of chronic small vessel ischemia. 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 No evidence of territorial infarct or acute ischemia. No extra-axial or intracranial fluid collection. Vascular: Atherosclerosis of skullbase vasculature without hyperdense vessel or abnormal calcification. Skull: No fracture or focal lesion. Sinuses/Orbits: Paranasal sinuses and mastoid air cells are clear. The visualized orbits are unremarkable. Other:  None. IMPRESSION: 1. No acute intracranial abnormality. No skull fracture. 2. Age related atrophy and chronic small vessel ischemia. 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 Electronically Signed   By: Keith Rake M.D.   On: 11/19/2021 21:26   CT Cervical Spine Wo Contrast  Result Date: 11/19/2021 CLINICAL DATA:  Neck  trauma (Age >= 65y) Pulled down stairs by dog, fall down steps. EXAM: CT CERVICAL SPINE WITHOUT CONTRAST TECHNIQUE: Multidetector CT imaging of the cervical spine was performed without intravenous contrast. Multiplanar CT image reconstructions were also generated. RADIATION DOSE REDUCTION: This exam was performed according to the departmental dose-optimization program which includes automated exposure control, adjustment of the mA and/or kV according to patient size and/or use of iterative reconstruction technique. COMPARISON:  None. FINDINGS: Alignment: No traumatic subluxation. There is trace retrolisthesis of C5 on C6 that is degenerative. Skull base and vertebrae: No acute fracture. Vertebral body heights are maintained. The dens and skull base are intact. Small benign appearing lucency within C6 vertebral body. Soft tissues and spinal canal: No prevertebral fluid or swelling. No visible canal hematoma. Disc levels: Diffuse degenerative disc disease with multilevel facet hypertrophy. There is multilevel neural foraminal narrowing. Mild spinal canal narrowing at C5-C6. Upper chest: Emphysema.  No acute findings. Other: Carotid calcifications. IMPRESSION: Multilevel degenerative disc disease and facet hypertrophy. No acute fracture or traumatic subluxation. Electronically Signed   By: Keith Rake M.D.   On: 11/19/2021 21:41   DG Chest Portable 1 View  Result Date: 11/19/2021 CLINICAL DATA:  Recent fall while walking dog with chest pain, initial encounter EXAM: PORTABLE CHEST 1 VIEW COMPARISON:  None. FINDINGS: Cardiac shadow is within normal limits. The lungs are well aerated bilaterally. No focal infiltrate or effusion is seen. No pneumothorax is noted. No discrete rib fracture is noted. Old healed rib fracture is noted on the right. Mild deformity of the proximal right humerus is noted likely related to prior fracture and healing. IMPRESSION: No acute abnormality noted. Electronically Signed   By: Inez Catalina M.D.   On: 11/19/2021 21:13   DG Hip Unilat W or Wo Pelvis 2-3 Views Right  Result Date: 11/19/2021 CLINICAL DATA:  Recent fall while walking dog with right hip pain, initial encounter EXAM: DG HIP (WITH OR WITHOUT PELVIS) 3V RIGHT COMPARISON:  None. FINDINGS: Comminuted intratrochanteric fracture is noted with impaction and angulation at the fracture site. Femoral head is well seated. The pelvic ring appears intact. Degenerative changes of the left hip joint and lumbar spine are noted. No soft tissue abnormality is seen. IMPRESSION: Right intratrochanteric femoral fracture. Electronically Signed   By: Inez Catalina M.D.   On: 11/19/2021 21:14     Positive ROS: All other systems have been reviewed and were otherwise negative with the exception of those mentioned in the HPI and as above.  Physical Exam: BP 106/62    Pulse (!) 107    Temp 98.2 F (36.8 C) (Oral)    Resp 20    Ht 5\' 6"  (1.676 m)    Wt 63.5 kg    SpO2 100%    BMI 22.60 kg/m  General:  Alert, no acute distress Psychiatric:  Patient is competent for consent with normal mood and affect   Cardiovascular:  No pedal edema, regular rate and rhythm Respiratory:  No wheezing, non-labored breathing GI:  Abdomen is soft and non-tender Skin:  No lesions in the area of chief complaint, no erythema Neurologic:  Sensation intact distally,  CN grossly intact Lymphatic:  No axillary or cervical lymphadenopathy  Orthopedic Exam:  RLE: 5/5 DF/PF/EHL SILT s/s/t/sp/dp distr Foot wwp +Log roll/axial load   X-rays:  As above: R intertrochanteric hip fracture  Assessment/Plan: Darren Castillo is a 71 y.o. male with a R intertrochanteric hip fracture   1. I discussed the various treatment options including both surgical and non-surgical management of the fracture with the patient. We discussed the high risk of perioperative complications due to patient's age, likely COPD, and smoking status. We also discussed smoking cessation. After  discussion of risks, benefits, and alternatives to surgery, the patient was in agreement to proceed with surgery.  The goals of surgery would be to provide adequate pain relief and allow for mobilization. Plan for surgery is R hip cephalomedullary nailing today, 11/20/2021. 2. NPO until OR 3. Hold anticoagulation in advance of OR     Leim Fabry   11/20/2021 7:39 AM

## 2021-11-20 NOTE — Anesthesia Preprocedure Evaluation (Signed)
Anesthesia Evaluation  ?Patient identified by MRN, date of birth, ID band ?Patient awake ? ? ? ?Reviewed: ?Allergy & Precautions, H&P , NPO status , Patient's Chart, lab work & pertinent test results, reviewed documented beta blocker date and time  ? ?History of Anesthesia Complications ?Negative for: history of anesthetic complications ? ?Airway ?Mallampati: II ? ?TM Distance: >3 FB ?Neck ROM: full ? ? ? Dental ?no notable dental hx. ?(+) Partial Upper, Missing, Dental Advidsory Given, Poor Dentition ?  ?Pulmonary ?neg shortness of breath, neg sleep apnea, COPD, neg recent URI, Current Smoker and Patient abstained from smoking.,  ?  ?Pulmonary exam normal ?breath sounds clear to auscultation ? ? ? ? ? ? Cardiovascular ?Exercise Tolerance: Good ?negative cardio ROS ?Normal cardiovascular exam ?Rhythm:regular Rate:Normal ? ? ?  ?Neuro/Psych ?negative neurological ROS ? negative psych ROS  ? GI/Hepatic ?negative GI ROS, Neg liver ROS,   ?Endo/Other  ?negative endocrine ROS ? Renal/GU ?negative Renal ROS  ?negative genitourinary ?  ?Musculoskeletal ? ? Abdominal ?  ?Peds ? Hematology ?negative hematology ROS ?(+)   ?Anesthesia Other Findings ?History reviewed. No pertinent past medical history. ? ? Reproductive/Obstetrics ?negative OB ROS ? ?  ? ? ? ? ? ? ? ? ? ? ? ? ? ?  ?  ? ? ? ? ? ? ? ? ?Anesthesia Physical ?Anesthesia Plan ? ?ASA: 2 ? ?Anesthesia Plan: Spinal  ? ?Post-op Pain Management:   ? ?Induction:  ? ?PONV Risk Score and Plan: 0 and Propofol infusion and TIVA ? ?Airway Management Planned: Natural Airway ? ?Additional Equipment:  ? ?Intra-op Plan:  ? ?Post-operative Plan:  ? ?Informed Consent: I have reviewed the patients History and Physical, chart, labs and discussed the procedure including the risks, benefits and alternatives for the proposed anesthesia with the patient or authorized representative who has indicated his/her understanding and acceptance.  ? ? ? ?Dental  Advisory Given ? ?Plan Discussed with: Anesthesiologist, CRNA and Surgeon ? ?Anesthesia Plan Comments:   ? ? ? ? ? ? ?Anesthesia Quick Evaluation ? ?

## 2021-11-20 NOTE — Progress Notes (Signed)
?Progress Note ? ?Patient: Darren Castillo JGG:[id number] DOB: 1951/02/28  ?DOA: 11/19/2021  DOS: 11/20/2021  ?  ?Brief hospital course: ?Darren Castillo is a 71 y.o. male with a history of tobacco use who presented to the ED 3/5 after being pulled down stairs while walking his dog, noting immediate right hip pain. In the ED VSS. Hgb 11.9g/dl, WBC 54Y, XR confirmed right intertrochanteric femoral fracture. He was given duonebs for wheezing and admitted, plans to undergo operative management of hip fracture 3/6. ? ?Assessment and Plan: ?* Closed right hip fracture, initial encounter (HCC) ?- To OR 3/6. ?- Postoperative pain control, VTE ppx, and weight-bearing status per orthopedics. ?- PT/OT to begin 3/7. ? ?COPD with acute exacerbation (HCC) ?No prior formal diagnosis of COPD, but had wheezing in ED. This has since resolved. Long term nicotine smoking of course predisposes to COPD, though no significant hyperinflation noted, in addition to no acute findings, on CXR.  ?- Continue inhaled bronchodilators. Hoping to avoid steroids to optimize wound healing. Currently stable enough to do so. ?- Recommend formal PFTs after discharge. ? ?Preoperative clearance ?Patient with history of nicotine dependence but no history of CAD, CHF, syncope, arrhythmias, stroke, OSA.  Denies history of COPD though appears to have a wheezing baseline.  Has good exercise tolerance ?EKG and chest x-ray nonacute ?Patient at low risk for perioperative cardiopulmonary complications ?Can proceed with proposed orthopedic repair ?Treat suspected COPD exacerbation to optimize pulmonary status prior to surgery ? ?Tobacco use disorder ?- Cessation counseling provided ? ?Subjective: Pain controlled as long as he's still. Moving causes constant severe pain in the right hip. ? ?Objective: ?Vitals:  ? 11/20/21 0600 11/20/21 0640 11/20/21 0900 11/20/21 1245  ?BP: 106/62  106/81 120/83  ?Pulse: 98 (!) 107 81 100  ?Resp: 20 20 19 16   ?Temp:   98 ?F (36.7 ?C) 98.6  ?F (37 ?C)  ?TempSrc:   Oral Temporal  ?SpO2: 93% 100% 90% 94%  ?Weight:    63.5 kg  ?Height:    5\' 6"  (1.676 m)  ? ?Gen: Pleasant, thin 71 y.o. male in no distress ?Pulm: Nonlabored breathing room air. No wheezes ?CV: Regular rate and rhythm. No murmur, rub, or gallop. No JVD, no dependent edema. ?GI: Abdomen soft, non-tender, non-distended, with normoactive bowel sounds.  ?Ext: Warm, RLE shortened, externally rotated without other deformities ?Skin: No rashes, lesions or ulcers on visualized skin. ?Neuro: Alert and oriented. No focal neurological deficits. ?Psych: Judgement and insight appear fair. Mood euthymic & affect congruent. Behavior is appropriate.   ? ?Data Personally reviewed: ?CBC: ?Recent Labs  ?Lab 11/19/21 ?2008  ?WBC 14.4*  ?NEUTROABS 12.2*  ?HGB 11.9*  ?HCT 36.1*  ?MCV 91.6  ?PLT 264  ? ?Basic Metabolic Panel: ?Recent Labs  ?Lab 11/19/21 ?2008  ?NA 137  ?K 3.8  ?CL 101  ?CO2 27  ?GLUCOSE 123*  ?BUN 17  ?CREATININE 0.71  ?CALCIUM 8.4*  ?   ?CT HEAD WO CONTRAST ( ) ? ?Result Date: 11/19/2021 ?CLINICAL DATA:  Head trauma, moderate-severe Got pulled down stairs by dog, fall down steps. EXAM: CT HEAD WITHOUT CONTRAST TECHNIQUE: Contiguous axial images were obtained from the base of the skull through the vertex without intravenous contrast. RADIATION DOSE REDUCTION: This exam was performed according to the departmental dose-optimization program which includes automated exposure control, adjustment of the mA and/or kV according to patient size and/or use of iterative reconstruction technique. COMPARISON:  None. FINDINGS: Brain: No intracranial hemorrhage, mass effect, or midline shift. Age related atrophy.  No hydrocephalus. The basilar cisterns are patent. There is moderate periventricular and deep white matter hypodensity typical of chronic small vessel ischemia. 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 No evidence of territorial infarct or acute ischemia. No extra-axial  or intracranial fluid collection. Vascular: Atherosclerosis of skullbase vasculature without hyperdense vessel or abnormal calcification. Skull: No fracture or focal lesion. Sinuses/Orbits: Paranasal sinuses and mastoid air cells are clear. The visualized orbits are unremarkable. Other: None. IMPRESSION: 1. No acute intracranial abnormality. No skull fracture. 2. Age related atrophy and chronic small vessel ischemia. 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 Electronically Signed   By: Narda Rutherford M.D.   On: 11/19/2021 21:26  ? ?CT Cervical Spine Wo Contrast ? ?Result Date: 11/19/2021 ?CLINICAL DATA:  Neck trauma (Age >= 65y) Pulled down stairs by dog, fall down steps. EXAM: CT CERVICAL SPINE WITHOUT CONTRAST TECHNIQUE: Multidetector CT imaging of the cervical spine was performed without intravenous contrast. Multiplanar CT image reconstructions were also generated. RADIATION DOSE REDUCTION: This exam was performed according to the departmental dose-optimization program which includes automated exposure control, adjustment of the mA and/or kV according to patient size and/or use of iterative reconstruction technique. COMPARISON:  None. FINDINGS: Alignment: No traumatic subluxation. There is trace retrolisthesis of C5 on C6 that is degenerative. Skull base and vertebrae: No acute fracture. Vertebral body heights are maintained. The dens and skull base are intact. Small benign appearing lucency within C6 vertebral body. Soft tissues and spinal canal: No prevertebral fluid or swelling. No visible canal hematoma. Disc levels: Diffuse degenerative disc disease with multilevel facet hypertrophy. There is multilevel neural foraminal narrowing. Mild spinal canal narrowing at C5-C6. Upper chest: Emphysema.  No acute findings. Other: Carotid calcifications. IMPRESSION: Multilevel degenerative disc disease and facet hypertrophy. No acute fracture or traumatic subluxation. Electronically Signed    By: Narda Rutherford M.D.   On: 11/19/2021 21:41  ? ?DG Chest Portable 1 View ? ?Result Date: 11/19/2021 ?CLINICAL DATA:  Recent fall while walking dog with chest pain, initial encounter EXAM: PORTABLE CHEST 1 VIEW COMPARISON:  None. FINDINGS: Cardiac shadow is within normal limits. The lungs are well aerated bilaterally. No focal infiltrate or effusion is seen. No pneumothorax is noted. No discrete rib fracture is noted. Old healed rib fracture is noted on the right. Mild deformity of the proximal right humerus is noted likely related to prior fracture and healing. IMPRESSION: No acute abnormality noted. Electronically Signed   By: Alcide Clever M.D.   On: 11/19/2021 21:13  ? ?DG C-Arm 1-60 Min-No Report ? ?Result Date: 11/20/2021 ?Fluoroscopy was utilized by the requesting physician.  No radiographic interpretation.  ? ?DG Hip Unilat W or Wo Pelvis 2-3 Views Right ? ?Result Date: 11/19/2021 ?CLINICAL DATA:  Recent fall while walking dog with right hip pain, initial encounter EXAM: DG HIP (WITH OR WITHOUT PELVIS) 3V RIGHT COMPARISON:  None. FINDINGS: Comminuted intratrochanteric fracture is noted with impaction and angulation at the fracture site. Femoral head is well seated. The pelvic ring appears intact. Degenerative changes of the left hip joint and lumbar spine are noted. No soft tissue abnormality is seen. IMPRESSION: Right intratrochanteric femoral fracture. Electronically Signed   By: Alcide Clever M.D.   On: 11/19/2021 21:14   ? ?SARS-CoV-2 PCR (3/5): Negative ?Influenza A/B: Negative ? ?Family Communication: None at bedside ? ?Disposition: ?Status is: Inpatient ?Remains inpatient appropriate because: Operative management of hip fracture.  ?Planned Discharge Destination:  Pending postoperative  therapy evaluations, anticipate SNF. ? ?Tyrone Nine, MD ?11/20/2021 2:31 PM ?Page by [email or web address]  ?

## 2021-11-20 NOTE — Progress Notes (Signed)
OT Cancellation Note ? ?Patient Details ?Name: Darren Castillo ?MRN: [id number] ?DOB: 1950/11/15 ? ? ?Cancelled Treatment:    Reason Eval/Treat Not Completed: Patient not medically ready. Per chart, plan for surgery today. Will await new OT orders to evaluate following surgery.  ? ?Arman Filter., MPH, MS, OTR/L ?ascom [phone] ?11/20/21, 7:41 AM ?

## 2021-11-20 NOTE — Progress Notes (Signed)
Initial Nutrition Assessment ? ?DOCUMENTATION CODES:  ? ?Not applicable ? ?INTERVENTION:  ? ?-Once diet is advanced add: ? ?-Ensure Enlive po BID, each supplement provides 350 kcal and 20 grams of protein.  ?-MVI with minerals daily ? ?NUTRITION DIAGNOSIS:  ? ?Increased nutrient needs related to post-op healing as evidenced by estimated needs. ? ?GOAL:  ? ?Patient will meet greater than or equal to 90% of their needs ? ?MONITOR:  ? ?PO intake, Supplement acceptance, Diet advancement, Labs, Weight trends, Skin, I & O's ? ?REASON FOR ASSESSMENT:  ? ?Consult ?Assessment of nutrition requirement/status, Hip fracture protocol ? ?ASSESSMENT:  ? ?Darren Castillo is a 71 y.o. male with medical history significant for Nicotine dependence who presents to the ED following a mechanical fall while walking his dog onto his right hip when his dog suddenly ran off.  He experienced immediate pain in his right hip.  He did not hit his head nor did he lose consciousness.  He denies any other injury.  He was previously in his usual state of health ? ?Pt admitted with closed rt hip fracture.  ? ?Reviewed I/O's: +150 ml x 24 hours ? ?UOP: 100 ml x 24 hours ? ?Per orthopedics notes, plan for cephalopmedullary nailing today. Pt is currently NPO for procedure.  ? ?Pt unavailable at time of visit. Pt in OR at time of visit. RD unable to obtain further nutrition-related history or complete nutrition-focused physical exam at this time.  ? ?Pt with increased nutritional needs for post-operative healing and would benefit from addition of oral nutrition supplements.   ? ?Reviewed wt hx. Pt was 130# on 03/26/18. No wt loss noted.  ? ?Medications reviewed.  ? ?Labs reviewed.  ? ?Diet Order:   ?Diet Order   ? ?       ?  Diet NPO time specified  Diet effective midnight       ?  ? ?  ?  ? ?  ? ? ?EDUCATION NEEDS:  ? ?No education needs have been identified at this time ? ?Skin:  Skin Assessment: Reviewed RN Assessment ? ?Last BM:  Unknown ? ?Height:   ? ?Ht Readings from Last 1 Encounters:  ?11/20/21 5\' 6"  (1.676 m)  ? ? ?Weight:  ? ?Wt Readings from Last 1 Encounters:  ?11/20/21 63.5 kg  ? ? ?Ideal Body Weight:  64.5 kg ? ?BMI:  Body mass index is 22.6 kg/m?. ? ?Estimated Nutritional Needs:  ? ?Kcal:  1900-2100 ? ?Protein:  95-110 grams ? ?Fluid:  > 1.9 L ? ? ? ?01/20/22, RD, LDN, CDCES ?Registered Dietitian II ?Certified Diabetes Care and Education Specialist ?Please refer to [facility] for RD and/or RD on-call/weekend/after hours pager  ?

## 2021-11-20 NOTE — H&P (Signed)
H&P reviewed. No significant changes noted.  

## 2021-11-21 ENCOUNTER — Encounter: Payer: Self-pay | Admitting: Orthopedic Surgery

## 2021-11-21 DIAGNOSIS — D62 Acute posthemorrhagic anemia: Secondary | ICD-10-CM

## 2021-11-21 LAB — CBC
HCT: 27.5 % — ABNORMAL LOW (ref 39.0–52.0)
Hemoglobin: 9.2 g/dL — ABNORMAL LOW (ref 13.0–17.0)
MCH: 30.7 pg (ref 26.0–34.0)
MCHC: 33.5 g/dL (ref 30.0–36.0)
MCV: 91.7 fL (ref 80.0–100.0)
Platelets: 189 10*3/uL (ref 150–400)
RBC: 3 MIL/uL — ABNORMAL LOW (ref 4.22–5.81)
RDW: 14.7 % (ref 11.5–15.5)
WBC: 7.8 10*3/uL (ref 4.0–10.5)
nRBC: 0 % (ref 0.0–0.2)

## 2021-11-21 LAB — BASIC METABOLIC PANEL
Anion gap: 7 (ref 5–15)
BUN: 20 mg/dL (ref 8–23)
CO2: 27 mmol/L (ref 22–32)
Calcium: 8 mg/dL — ABNORMAL LOW (ref 8.9–10.3)
Chloride: 101 mmol/L (ref 98–111)
Creatinine, Ser: 0.65 mg/dL (ref 0.61–1.24)
GFR, Estimated: >60 mL/min (ref >60)
Glucose, Bld: 104 mg/dL — ABNORMAL HIGH (ref 70–99)
Potassium: 3.7 mmol/L (ref 3.5–5.1)
Sodium: 135 mmol/L (ref 135–145)

## 2021-11-21 MED ORDER — ENSURE ENLIVE PO LIQD
237.0000 mL | Freq: Two times a day (BID) | ORAL | Status: DC
  Administered 2021-11-21 – 2021-11-22 (×2): 237 mL via ORAL

## 2021-11-21 MED ORDER — ENOXAPARIN SODIUM 40 MG/0.4ML IJ SOSY: 40.0000 mg | PREFILLED_SYRINGE | INTRAMUSCULAR | 0 refills | Status: AC

## 2021-11-21 MED ORDER — TRAMADOL HCL 50 MG PO TABS: 50.0000 mg | ORAL_TABLET | Freq: Four times a day (QID) | ORAL | 0 refills | Status: AC | PRN

## 2021-11-21 MED ORDER — OXYCODONE HCL 5 MG PO TABS: 2.5000 mg | ORAL_TABLET | ORAL | 0 refills | Status: AC | PRN

## 2021-11-21 MED ORDER — IPRATROPIUM-ALBUTEROL 0.5-2.5 (3) MG/3ML IN SOLN
3.0000 mL | Freq: Two times a day (BID) | RESPIRATORY_TRACT | Status: DC
  Administered 2021-11-21: 3 mL via RESPIRATORY_TRACT
  Filled 2021-11-21: qty 3

## 2021-11-21 NOTE — TOC Progression Note (Signed)
Transition of Care (TOC) - Progression Note  ? ? ?Patient Details  ?Name: Darren Castillo ?MRN: [id number] ?Date of Birth: [birth year] ? ?Transition of Care (TOC) CM/SW Contact  ?Ruston Fedora A Tiwanna Tuch, LCSW ?Phone Number: ?11/21/2021, 4:05 PM ? ?Clinical Narrative:  CSW provided bed offers and pt chooses [facility].  ? ? ? ?Expected Discharge Plan: Skilled Nursing Facility ?Barriers to Discharge: Continued Medical Work up ? ?Expected Discharge Plan and Services ?Expected Discharge Plan: Skilled Nursing Facility ?  ?  ?  ?  ?                ?  ?  ?  ?  ?  ?  ?  ?  ?  ?  ? ? ?Social Determinants of Health (SDOH) Interventions ?  ? ?Readmission Risk Interventions ?No flowsheet data found. ? ?

## 2021-11-21 NOTE — Anesthesia Postprocedure Evaluation (Signed)
Anesthesia Post Note ? ?Patient: Darren Castillo ? ?Procedure(s) Performed: INTRAMEDULLARY (IM) NAIL INTERTROCHANTRIC (Right: Hip) ? ?Patient location during evaluation: Nursing Unit ?Anesthesia Type: Spinal ?Level of consciousness: oriented and awake and alert ?Pain management: pain level controlled ?Vital Signs Assessment: post-procedure vital signs reviewed and stable ?Respiratory status: spontaneous breathing and respiratory function stable ?Cardiovascular status: blood pressure returned to baseline and stable ?Postop Assessment: no headache, no backache, no apparent nausea or vomiting and patient able to bend at knees ?Anesthetic complications: no ? ? ?No notable events documented. ? ? ?Last Vitals:  ?Vitals:  ? 11/21/21 0518 11/21/21 0731  ?BP: 134/73 107/70  ?Pulse: 86 90  ?Resp: 18 16  ?Temp: 36.6 ?C 36.4 ?C  ?SpO2: 100% 98%  ?  ?Last Pain:  ?Vitals:  ? 11/21/21 0734  ?TempSrc:   ?PainSc: 0-No pain  ? ? ?  ?  ?  ?  ?  ?  ? ?Irving Burton M ? ? ? ? ?

## 2021-11-21 NOTE — Progress Notes (Signed)
Nutrition Follow-up ? ?DOCUMENTATION CODES:  ? ?Not applicable ? ?INTERVENTION:  ? ?-MVI with minerals daily ?-Ensure Enlive po BID, each supplement provides 350 kcal and 20 grams of protein.  ? ?NUTRITION DIAGNOSIS:  ? ?Increased nutrient needs related to post-op healing as evidenced by estimated needs. ? ?Ongoing ? ?GOAL:  ? ?Patient will meet greater than or equal to 90% of their needs ? ?Progressing  ? ?MONITOR:  ? ?PO intake, Supplement acceptance, Diet advancement, Labs, Weight trends, Skin, I & O's ? ?REASON FOR ASSESSMENT:  ? ?Consult ?Assessment of nutrition requirement/status, Hip fracture protocol ? ?ASSESSMENT:  ? ?Darren Castillo is a 71 y.o. male with medical history significant for Nicotine dependence who presents to the ED following a mechanical fall while walking his dog onto his right hip when his dog suddenly ran off.  He experienced immediate pain in his right hip.  He did not hit his head nor did he lose consciousness.  He denies any other injury.  He was previously in his usual state of health ? ?3/7- s/p PROCEDURE: Intramedullary nailing of Right femur with cephalomedullary device ? ?Reviewed I/O's: +150 ml x 24 hours ? ?UOP: 400 ml x 24 hours ? ?Spoke with pt at bedside. He reports feeling a little better today and was happy to transfer to the chair. He shares that he consumed about 50% of his breakfast this AM. Pt shares he usually consumes 1 meal per day ([employer]) and snacks on chips, water, and tea throughout the day.  ? ?Pt denies any weight loss.  ? ?Discussed importance of good meal and supplement intake to promote healing.  ?  ?Medications reviewed and include colace.  ? ?Labs reviewed.  ? ?NUTRITION - FOCUSED PHYSICAL EXAM: ? ?Flowsheet Row Most Recent Value  ?Orbital Region No depletion  ?Upper Arm Region No depletion  ?Thoracic and Lumbar Region No depletion  ?Buccal Region No depletion  ?Temple Region No depletion  ?Clavicle Bone Region No depletion  ?Clavicle and Acromion Bone  Region No depletion  ?Scapular Bone Region No depletion  ?Dorsal Hand No depletion  ?Patellar Region No depletion  ?Anterior Thigh Region No depletion  ?Posterior Calf Region No depletion  ?Edema (RD Assessment) None  ?Hair Reviewed  ?Eyes Reviewed  ?Mouth Reviewed  ?Skin Reviewed  ?Nails Reviewed  ? ?  ? ? ?Diet Order:   ?Diet Order   ? ?       ?  Diet regular Room service appropriate? Yes; Fluid consistency: Thin  Diet effective now       ?  ? ?  ?  ? ?  ? ? ?EDUCATION NEEDS:  ? ?No education needs have been identified at this time ? ?Skin:  Skin Assessment: Reviewed RN Assessment ? ?Last BM:  Unknown ? ?Height:  ? ?Ht Readings from Last 1 Encounters:  ?11/20/21 5\' 6"  (1.676 m)  ? ? ?Weight:  ? ?Wt Readings from Last 1 Encounters:  ?11/20/21 63.5 kg  ? ? ?Ideal Body Weight:  64.5 kg ? ?BMI:  Body mass index is 22.6 kg/m?. ? ?Estimated Nutritional Needs:  ? ?Kcal:  1900-2100 ? ?Protein:  95-110 grams ? ?Fluid:  > 1.9 L ? ? ? ?Loistine Chance, RD, LDN, CDCES ?Registered Dietitian II ?Certified Diabetes Care and Education Specialist ?Please refer to [facility] for RD and/or RD on-call/weekend/after hours pager  ?

## 2021-11-21 NOTE — Progress Notes (Signed)
?  Subjective: ?1 Day Post-Op Procedure(s) (LRB): ?INTRAMEDULLARY (IM) NAIL INTERTROCHANTRIC (Right) ?Patient reports pain as mild.   ?Patient is well, and has had no acute complaints or problems ?Plan is to go Rehab after hospital stay. ?Negative for chest pain and shortness of breath ?Fever: no ?Gastrointestinal: Negative for nausea and vomiting ? ?Objective: ?Vital signs in last 24 hours: ?Temp:  [96.8 ?F (36 ?C)-99.2 ?F (37.3 ?C)] 97.9 ?F (36.6 ?C) (03/07 0518) ?Pulse Rate:  [78-103] 86 (03/07 0518) ?Resp:  [11-24] 18 (03/07 0518) ?BP: (88-158)/(53-137) 134/73 (03/07 0518) ?SpO2:  [87 %-100 %] 100 % (03/07 0518) ?Weight:  [63.5 kg] 63.5 kg (03/06 1245) ? ?Intake/Output from previous day: ? ?Intake/Output Summary (Last 24 hours) at 11/21/2021 0651 ?Last data filed at 11/21/2021 2263 ?Gross per 24 hour  ?Intake 600 ml  ?Output 450 ml  ?Net 150 ml  ?  ?Intake/Output this shift: ?Total I/O ?In: -  ?Out: 400 [Urine:400] ? ?Labs: ?Recent Labs  ?  11/19/21 ?2008 11/21/21 ?0323  ?HGB 11.9* 9.2*  ? ?Recent Labs  ?  11/19/21 ?2008 11/21/21 ?0323  ?WBC 14.4* 7.8  ?RBC 3.94* 3.00*  ?HCT 36.1* 27.5*  ?PLT 264 189  ? ?Recent Labs  ?  11/19/21 ?2008 11/21/21 ?0323  ?NA 137 135  ?K 3.8 3.7  ?CL 101 101  ?CO2 27 27  ?BUN 17 20  ?CREATININE 0.71 0.65  ?GLUCOSE 123* 104*  ?CALCIUM 8.4* 8.0*  ? ?No results for input(s): LABPT, INR in the last 72 hours. ? ? ?EXAM ?General - Patient is Alert and Oriented ?Extremity - Neurovascular intact ?Sensation intact distally ?Dorsiflexion/Plantar flexion intact ?Compartment soft ?Dressing/Incision - clean, dry, no drainage ?Motor Function - intact, moving foot and toes well on exam.  ? ?History reviewed. No pertinent past medical history. ? ?Assessment/Plan: ?1 Day Post-Op Procedure(s) (LRB): ?INTRAMEDULLARY (IM) NAIL INTERTROCHANTRIC (Right) ?Principal Problem: ?  Closed right hip fracture, initial encounter (HCC) ?Active Problems: ?  Accidental fall ?  Preoperative clearance ?  COPD with acute  exacerbation (HCC) ?  Tobacco use disorder ? ?Estimated body mass index is 22.6 kg/m? as calculated from the following: ?  Height as of this encounter: 5\' 6"  (1.676 m). ?  Weight as of this encounter: 63.5 kg. ?Advance diet ?Up with therapy ?D/C IV fluids ? ?Discharge planning.  Most likely rehab.  Awaiting physical therapy. ? ?DVT Prophylaxis - Lovenox, Foot Pumps, and TED hose ?Weight-Bearing as tolerated to right leg ? ? , PA-C ?Orthopaedic Surgery ?11/21/2021, 6:51 AM ? ?

## 2021-11-21 NOTE — [place] FL2 (Signed)
?[place] MEDICAID FL2 LEVEL OF CARE SCREENING TOOL  ?  ? ?IDENTIFICATION  ?Patient Name: ?Darren Castillo Birthdate: 1950/11/20 Sex: male Admission Date (Current Location): ?11/19/2021  ?[place] Number: ? [place] ?  Facility and Address:  ?[facility], [address] [place], [place] [address] ?     Provider Number: ?[id number]  ?Attending Physician Name and Address:  ?Tyrone Nine, MD ? Relative Name and Phone Number:  ?Steward Drone [phone] ?   ?Current Level of Care: ?Hospital Recommended Level of Care: ?Skilled Nursing Facility Prior Approval Number: ?  ? ?Date Approved/Denied: ?  PASRR Number: ?[id number] A ? ?Discharge Plan: ?SNF ?  ? ?Current Diagnoses: ?Patient Active Problem List  ? Diagnosis Date Noted  ? Closed right hip fracture, initial encounter (HCC) 11/19/2021  ? Accidental fall 11/19/2021  ? Preoperative clearance 11/19/2021  ? COPD with acute exacerbation (HCC) 11/19/2021  ? Tobacco use disorder 11/19/2021  ? ? ?Orientation RESPIRATION BLADDER Height & Weight   ?  ?Self, Time, Situation, Place ? Normal Continent Weight: 63.5 kg ?Height:  5\' 6"  (167.6 cm)  ?BEHAVIORAL SYMPTOMS/MOOD NEUROLOGICAL BOWEL NUTRITION STATUS  ?    Continent Diet (DC summary)  ?AMBULATORY STATUS COMMUNICATION OF NEEDS Skin   ?Extensive Assist Verbally Normal, Surgical wounds ?  ?  ?  ?    ?     ?     ? ? ?Personal Care Assistance Level of Assistance  ?Bathing, Feeding, Dressing Bathing Assistance: Limited assistance ?Feeding assistance: Independent ?Dressing Assistance: Limited assistance ?   ? ?Functional Limitations Info  ?    ?  ?   ? ? ?SPECIAL CARE FACTORS FREQUENCY  ?PT (By licensed PT), OT (By licensed OT)   ?  ?PT Frequency: 5 times per week ?OT Frequency: 5 times per week ?  ?  ?  ?   ? ? ?Contractures Contractures Info: Not present  ? ? ?Additional Factors Info  ?Code Status, Allergies Code Status Info: full code ?Allergies Info: NKDA ?  ?  ?  ?   ? ?Current Medications  (11/21/2021):  This is the current hospital active medication list ?Current Facility-Administered Medications  ?Medication Dose Route Frequency Provider Last Rate Last Admin  ? 0.9 %  sodium chloride infusion   Intravenous Continuous 01/21/2022, MD 75 mL/hr at 11/20/21 1936 New Bag at 11/20/21 1936  ? acetaminophen (TYLENOL) tablet 1,000 mg  1,000 mg Oral Q8H 01/20/22, MD   1,000 mg at 11/21/21 0515  ? albuterol (PROVENTIL) (2.5 MG/3ML) 0.083% nebulizer solution 2.5 mg  2.5 mg Nebulization Q2H PRN 01/21/22, MD      ? bisacodyl (DULCOLAX) suppository 10 mg  10 mg Rectal Daily PRN Signa Kell, MD      ? ceFAZolin (ANCEF) IVPB 2g/100 mL premix  2 g Intravenous Q8H Signa Kell, MD 200 mL/hr at 11/21/21 0517 2 g at 11/21/21 0517  ? docusate sodium (COLACE) capsule 100 mg  100 mg Oral BID 01/21/22, MD   100 mg at 11/21/21 01/21/22  ? enoxaparin (LOVENOX) injection 40 mg  40 mg Subcutaneous Q24H 1700, MD   40 mg at 11/21/21 01/21/22  ? HYDROmorphone (DILAUDID) injection 0.2-0.4 mg  0.2-0.4 mg Intravenous Q4H PRN 1749, MD      ? ipratropium-albuterol (DUONEB) 0.5-2.5 (3) MG/3ML nebulizer solution 3 mL  3 mL Nebulization TID Signa Kell, MD   3 mL at 11/21/21 0723  ? ketorolac (TORADOL) 15 MG/ML injection 7.5 mg  7.5 mg Intravenous Q6H Signa Kell, MD   7.5 mg at 11/21/21 0656  ? methocarbamol (ROBAXIN) tablet 500 mg  500 mg Oral Q6H PRN Signa Kell, MD      ? Or  ? methocarbamol (ROBAXIN) 500 mg in dextrose 5 % 50 mL IVPB  500 mg Intravenous Q6H PRN Signa Kell, MD      ? metoCLOPramide (REGLAN) tablet 5-10 mg  5-10 mg Oral Q8H PRN Signa Kell, MD      ? Or  ? metoCLOPramide (REGLAN) injection 5-10 mg  5-10 mg Intravenous Q8H PRN Signa Kell, MD      ? multivitamin with minerals tablet 1 tablet  1 tablet Oral Daily Signa Kell, MD   1 tablet at 11/21/21 0947  ? ondansetron (ZOFRAN) tablet 4 mg  4 mg Oral Q6H PRN Signa Kell, MD      ? Or  ? ondansetron [facility]) injection 4 mg  4 mg Intravenous  Q6H PRN Signa Kell, MD      ? oxyCODONE (Oxy IR/ROXICODONE) immediate release tablet 2.5-5 mg  2.5-5 mg Oral Q4H PRN Signa Kell, MD      ? oxyCODONE (Oxy IR/ROXICODONE) immediate release tablet 5-10 mg  5-10 mg Oral Q4H PRN Signa Kell, MD   5 mg at 11/21/21 0962  ? senna-docusate (Senokot-S) tablet 1 tablet  1 tablet Oral QHS PRN Signa Kell, MD      ? sodium phosphate (FLEET) 7-19 GM/118ML enema 1 enema  1 enema Rectal Once PRN Signa Kell, MD      ? traMADol Janean Sark) tablet 50 mg  50 mg Oral Q6H PRN Signa Kell, MD      ? ? ? ?Discharge Medications: ?Please see discharge summary for a list of discharge medications. ? ?Relevant Imaging Results: ? ?Relevant Lab Results: ? ? ?Additional Information ?[id number] ? ?Marlowe Sax, RN ? ? ? ? ?

## 2021-11-21 NOTE — Evaluation (Signed)
Occupational Therapy Evaluation ?Patient Details ?Name: Darren Castillo ?MRN: [id number] ?DOB: [birth year] ?Today's Date: 11/21/2021 ? ? ?History of Present Illness 71 y.o. male with medical history significant for Nicotine dependence who presents to the ED following a mechanical fall while walking his dog onto his right hip when his dog suddenly ran off.  He experienced immediate pain in his right hip.  He did not hit his head nor did he lose consciousness.  He denies any other injury.  Pt s/p R hip IM nailing.  ? ?Clinical Impression ?  ?Patient presenting with decreased Ind in self care, balance, functional mobility/transfers, endurance, and safety awareness. Patient reports living at home alone but would be able to stay with sister in law who has level entry into her one level home. Pt does not use AD at baseline and reports being independent in all aspects of care. Patient needing min - mod lifting assistance to stand from recliner chair. Pt reports 7/10 pain in R LE. Pt ambulates 10' total with two turns with use of RW and min A. Pt fatigues very quickly and reports feeling very weak in B LEs and noticeable muscle fatigue without buckle. Pt's HR increased to 140's with activity and O2 dropped to 85% while on RA and placed back on 2Ls with cuing for pursed lip breathing to return to 90's. Patient will benefit from acute OT to increase overall independence in the areas of ADLs, functional mobility, and safety awareness in order to safely discharge to next venue of care.  ?   ? ?Recommendations for follow up therapy are one component of a multi-disciplinary discharge planning process, led by the attending physician.  Recommendations may be updated based on patient status, additional functional criteria and insurance authorization.  ? ?Follow Up Recommendations ? Skilled nursing-short term rehab (<3 hours/day)  ?  ?Assistance Recommended at Discharge Frequent or constant Supervision/Assistance  ?Patient can return home  with the following A little help with walking and/or transfers;A lot of help with bathing/dressing/bathroom;Help with stairs or ramp for entrance;Assist for transportation ? ?  ?Functional Status Assessment ? Patient has had a recent decline in their functional status and demonstrates the ability to make significant improvements in function in a reasonable and predictable amount of time.  ?Equipment Recommendations ? BSC/3in1  ?  ?   ?Precautions / Restrictions Precautions ?Precautions: Fall ?Restrictions ?RLE Weight Bearing: Weight bearing as tolerated  ? ?  ? ?Mobility Bed Mobility ?  ?  ?  ?  ?  ?  ?  ?General bed mobility comments: Pt received in recliner chair ?  ? ?Transfers ?Overall transfer level: Needs assistance ?Equipment used: Rolling walker (2 wheels) ?Transfers: Sit to/from Stand, Bed to chair/wheelchair/BSC ?Sit to Stand: Min assist, Mod assist ?  ?  ?Step pivot transfers: Min assist ?  ?  ?General transfer comment: mod assist initially progressing to min with cuing for technique and hand placement ?  ? ?  ?Balance Overall balance assessment: Needs assistance ?Sitting-balance support: Feet supported, Single extremity supported ?Sitting balance-Leahy Scale: Good ?  ?  ?Standing balance support: During functional activity, Reliant on assistive device for balance ?Standing balance-Leahy Scale: Fair ?  ?  ?  ?  ?  ?  ?  ?  ?  ?  ?  ?  ?   ? ?ADL either performed or assessed with clinical judgement  ? ?ADL Overall ADL's : Needs assistance/impaired ?Eating/Feeding: Modified independent ?  ?Grooming: Wash/dry hands;Wash/dry face;Sitting;Supervision/safety;Set up ?  ?  ?  ?  ?  ?  ?  ?  ?  ?  Toilet Transfer: Minimal assistance;Moderate assistance;Rolling walker (2 wheels) ?Toilet Transfer Details (indicate cue type and reason): simulated- mod lifting assist to stand ?  ?  ?  ?  ?  ?   ? ? ? ?Vision Patient Visual Report: No change from baseline ?   ?   ?   ?   ? ?Pertinent Vitals/Pain Pain Assessment ?Pain  Assessment: 0-10 ?Pain Score: 7  ?Pain Location: R hip ?Pain Descriptors / Indicators: Aching, Discomfort, Guarding ?Pain Intervention(s): Limited activity within patient's tolerance, Monitored during session, Premedicated before session  ? ? ? ?Hand Dominance Right ?  ?Extremity/Trunk Assessment Upper Extremity Assessment ?Upper Extremity Assessment: Overall WFL for tasks assessed ?  ?Lower Extremity Assessment ?Lower Extremity Assessment: Generalized weakness ?  ?  ?  ?Communication Communication ?Communication: No difficulties ?  ?Cognition Arousal/Alertness: Awake/alert ?Behavior During Therapy: [facility] for tasks assessed/performed ?Overall Cognitive Status: Within Functional Limits for tasks assessed ?  ?  ?  ?  ?  ?  ?  ?  ?  ?  ?  ?  ?  ?  ?  ?  ?  ?  ?  ?   ?   ?   ? ? ?Home Living Family/patient expects to be discharged to:: Skilled nursing facility ?Living Arrangements: Alone ?  ?  ?  ?  ?  ?  ?  ?  ?  ?  ?  ?  ?  ?  ?  ?Additional Comments: Pt reports if returning home he would go to stay with sister in law who has level entry and 1 story home. She has standard height toilet. ?  ? ?  ?Prior Functioning/Environment Prior Level of Function : Independent/Modified Independent ?  ?  ?  ?  ?  ?  ?Mobility Comments: Pt reports no use of AD at baseline. ?ADLs Comments: Ind with self care, IADLs, and driving at baseline per pt report. ?  ? ?  ?  ?OT Problem List: Decreased strength;Decreased activity tolerance;Impaired balance (sitting and/or standing);Decreased safety awareness;Pain;Cardiopulmonary status limiting activity;Decreased knowledge of use of DME or AE ?  ?   ?OT Treatment/Interventions: Self-care/ADL training;Manual therapy;Therapeutic exercise;Modalities;Patient/family education;Balance training;Energy conservation;Therapeutic activities;DME and/or AE instruction  ?  ?OT Goals(Current goals can be found in the care plan section) Acute Rehab OT Goals ?Patient Stated Goal: to go home ?OT Goal Formulation:  With patient ?Time For Goal Achievement: 12/05/21 ?Potential to Achieve Goals: Fair ?ADL Goals ?Pt Will Perform Grooming: with supervision;standing ?Pt Will Perform Lower Body Dressing: with supervision;sit to/from stand ?Pt Will Transfer to Toilet: with supervision;ambulating ?Pt Will Perform Toileting - Clothing Manipulation and hygiene: with supervision;sit to/from stand  ?OT Frequency: Min 2X/week ?  ? ?   ?AM-PAC OT "6 Clicks" Daily Activity     ?Outcome Measure Help from another person eating meals?: None ?Help from another person taking care of personal grooming?: None ?Help from another person toileting, which includes using toliet, bedpan, or urinal?: A Lot ?Help from another person bathing (including washing, rinsing, drying)?: A Lot ?Help from another person to put on and taking off regular upper body clothing?: None ?Help from another person to put on and taking off regular lower body clothing?: A Lot ?6 Click Score: 18 ?  ?End of Session Equipment Utilized During Treatment: Rolling walker (2 wheels);Oxygen ?Nurse Communication: Mobility status ? ?Activity Tolerance: Patient tolerated treatment well ?Patient left: with call bell/phone within reach;in chair;with chair alarm set ? ?OT Visit Diagnosis: Unsteadiness on feet (R26.81);Muscle  weakness (generalized) (M62.81);History of falling (Z91.81);Pain ?Pain - Right/Left: Right ?Pain - part of body: Hip  ?              ?Time: [id number] ?OT Time Calculation (min): 18 min ?Charges:  OT General Charges ?$OT Visit: 1 Visit ?OT Evaluation ?$OT Eval Moderate Complexity: 1 Mod ?OT Treatments ?$Therapeutic Activity: 8-22 mins ? ?Darleen Crocker, MS, OTR/L , CBIS ?ascom [phone]  ?11/21/21, 1:52 PM  ?

## 2021-11-21 NOTE — Evaluation (Signed)
Physical Therapy Evaluation Patient Details Name: Darren Castillo MRN: [id number] DOB: Aug 06, 1951 Today's Date: 11/21/2021  History of Present Illness  Pt is a 72 yo male with medical history significant for nicotine dependence and possible undiagnosed COPD who presented to the ED s/p fall while walking his dog resulting in R hip pain.  Pt diagnosed with right intertrochanteric hip fracture and is now s/p IM nailing.   Clinical Impression  Pt was pleasant and motivated to participate during the session and put forth good effort throughout. Pt required physical assistance with transfers and gait and was only able to amb a max of 6 feet before needing the chair brought to him to return to sitting.  Pt was very antalgic with gait with difficulty advancing his LLE even with cues for step-to pattern for maximum BUE assist with the RW.  Pt's SpO2 was 99% on 2L at rest and mid 90s at rest on room air.  After amb on room air pt's SpO2 dropped to 86% and he was returned to 2LO2 with SpO2 quickly increasing back to the mid 90s.  Pt will benefit from PT services in a SNF setting upon discharge to safely address deficits listed in patient problem list for decreased caregiver assistance and eventual return to PLOF.         Recommendations for follow up therapy are one component of a multi-disciplinary discharge planning process, led by the attending physician.  Recommendations may be updated based on patient status, additional functional criteria and insurance authorization.  Follow Up Recommendations Skilled nursing-short term rehab (<3 hours/day)    Assistance Recommended at Discharge Frequent or constant Supervision/Assistance  Patient can return home with the following  Two people to help with walking and/or transfers;Two people to help with bathing/dressing/bathroom;Assistance with cooking/housework;Direct supervision/assist for medications management    Equipment Recommendations BSC/3in1;Other  (comment) (TBD at next venue of care if applicable)  Recommendations for Other Services       Functional Status Assessment Patient has had a recent decline in their functional status and demonstrates the ability to make significant improvements in function in a reasonable and predictable amount of time.     Precautions / Restrictions Precautions Precautions: Fall Restrictions Weight Bearing Restrictions: Yes RLE Weight Bearing: Weight bearing as tolerated      Mobility  Bed Mobility               General bed mobility comments: NT, pt received in recliner    Transfers Overall transfer level: Needs assistance Equipment used: Rolling walker (2 wheels) Transfers: Sit to/from Stand, Bed to chair/wheelchair/BSC Sit to Stand: Min assist           General transfer comment: Min A to come to full upright position with mod verbal cues for hand placement    Ambulation/Gait Ambulation/Gait assistance: Min assist Gait Distance (Feet): 6 Feet Assistive device: Rolling walker (2 wheels) Gait Pattern/deviations: Step-to pattern, Antalgic, Decreased stance time - right, Trunk flexed, Shuffle Gait velocity: decreased     General Gait Details: Pt was able to amb a max of 6 feet with antalgic step-to pattern and mod verbal cues for sequencing.  Pt had difficulty advancing the LLE with extensive BUE support on the RW to assist  Stairs            Wheelchair Mobility    Modified Rankin (Stroke Patients Only)       Balance Overall balance assessment: Needs assistance Sitting-balance support: Feet supported, Single extremity supported Sitting balance-Leahy Scale: Good  Standing balance support: During functional activity, Reliant on assistive device for balance, Bilateral upper extremity supported Standing balance-Leahy Scale: Poor                               Pertinent Vitals/Pain Pain Assessment Pain Assessment: 0-10 Pain Score: 6  Pain Location:  R hip Pain Descriptors / Indicators: Aching, Discomfort, Guarding Pain Intervention(s): Repositioned, Premedicated before session, Monitored during session    Home Living Family/patient expects to be discharged to:: Private residence Living Arrangements: Alone Available Help at Discharge: Family;Available PRN/intermittently Type of Home: House Home Access: Stairs to enter Entrance Stairs-Rails: Right;Left;Can reach both Entrance Stairs-Number of Steps: 6   Home Layout: One level Home Equipment: Conservation officer, nature (2 wheels) Additional Comments: Pt reports if returning home he would go to stay with sister in law who has level entry and 1 story home. She has standard height toilet. Pt would need to enter his home for supplies prior to staying with SIL who works part time during the day.    Prior Function Prior Level of Function : Independent/Modified Independent             Mobility Comments: Ind amb without an AD limited community distances, no other fall hx other than current fall that occured when dog pulled him down while walking on leash ADLs Comments: Ind with self care, IADLs, and driving at baseline per pt report.     Hand Dominance   Dominant Hand: Right    Extremity/Trunk Assessment   Upper Extremity Assessment Upper Extremity Assessment: Overall WFL for tasks assessed    Lower Extremity Assessment Lower Extremity Assessment: Generalized weakness;RLE deficits/detail RLE: Unable to fully assess due to pain       Communication   Communication: No difficulties  Cognition Arousal/Alertness: Awake/alert Behavior During Therapy: WFL for tasks assessed/performed Overall Cognitive Status: Within Functional Limits for tasks assessed                                          General Comments      Exercises Total Joint Exercises Ankle Circles/Pumps: AROM, Strengthening, Both, 10 reps Quad Sets: 10 reps, Both, Strengthening Gluteal Sets:  Strengthening, Both, 10 reps Hip ABduction/ADduction: AAROM, Strengthening, Both, 5 reps Straight Leg Raises: AAROM, Strengthening, Both, 5 reps Long Arc Quad: AROM, Strengthening, Both, 10 reps Knee Flexion: AROM, Strengthening, Both, 10 reps Marching in Standing: AROM, Strengthening, Right, 5 reps, Standing Other Exercises Other Exercises: HEP education for BLE APs, QS, and GS x 10 each 5-6x/day   Assessment/Plan    PT Assessment Patient needs continued PT services  PT Problem List Decreased strength;Decreased activity tolerance;Decreased balance;Decreased mobility;Decreased knowledge of use of DME;Pain       PT Treatment Interventions DME instruction;Gait training;Stair training;Functional mobility training;Therapeutic activities;Therapeutic exercise;Balance training;Patient/family education    PT Goals (Current goals can be found in the Care Plan section)  Acute Rehab PT Goals Patient Stated Goal: To get up and walk PT Goal Formulation: With patient Time For Goal Achievement: 12/04/21 Potential to Achieve Goals: Good    Frequency BID     Co-evaluation               AM-PAC PT "6 Clicks" Mobility  Outcome Measure Help needed turning from your back to your side while in a flat bed without using bedrails?: A  Lot Help needed moving from lying on your back to sitting on the side of a flat bed without using bedrails?: A Lot Help needed moving to and from a bed to a chair (including a wheelchair)?: A Lot Help needed standing up from a chair using your arms (e.g., wheelchair or bedside chair)?: A Lot Help needed to walk in hospital room?: A Lot Help needed climbing 3-5 steps with a railing? : Total 6 Click Score: 11    End of Session Equipment Utilized During Treatment: Gait belt Activity Tolerance: Patient limited by pain Patient left: in chair;with call bell/phone within reach;with chair alarm set;with SCD's reapplied Nurse Communication: Mobility status;Weight bearing  status PT Visit Diagnosis: Unsteadiness on feet (R26.81);Other abnormalities of gait and mobility (R26.89);Muscle weakness (generalized) (M62.81);Pain Pain - Right/Left: Right Pain - part of body: Hip    Time: [id number] PT Time Calculation (min) (ACUTE ONLY): 37 min   Charges:   PT Evaluation $PT Eval Moderate Complexity: 1 Mod PT Treatments $Therapeutic Exercise: 8-22 mins       D. Scott Lonzy Mato PT, DPT 11/21/21, 1:33 PM

## 2021-11-21 NOTE — Discharge Instructions (Signed)
INSTRUCTIONS AFTER Surgery ? ?Remove items at home which could result in a fall. This includes throw rugs or furniture in walking pathways ?ICE to the affected joint every three hours while awake for 30 minutes at a time, for at least the first 3-5 days, and then as needed for pain and swelling.  Continue to use ice for pain and swelling. You may notice swelling that will progress down to the foot and ankle.  This is normal after surgery.  Elevate your leg when you are not up walking on it.   ?Continue to use the breathing machine you got in the hospital (incentive spirometer) which will help keep your temperature down.  It is common for your temperature to cycle up and down following surgery, especially at night when you are not up moving around and exerting yourself.  The breathing machine keeps your lungs expanded and your temperature down. ? ? ?DIET:  As you were doing prior to hospitalization, we recommend a well-balanced diet. ? ?DRESSING / WOUND CARE / SHOWERING ? ?Dressing change as needed.  No showering until staples are removed. ? ?ACTIVITY ? ?Increase activity slowly as tolerated, but follow the weight bearing instructions below.   ?No driving for 6 weeks or until further direction given by your physician.  You cannot drive while taking narcotics.  ?No lifting or carrying greater than 10 lbs. until further directed by your surgeon. ?Avoid periods of inactivity such as sitting longer than an hour when not asleep. This helps prevent blood clots.  ?You may return to work once you are authorized by your doctor.  ? ? ? ?WEIGHT BEARING  ?Weightbearing as tolerated on the right with a walker. ? ? ?EXERCISES ?Ambulation and gait training.  Strength training.  Occupational Therapy for ADLs. ? ?CONSTIPATION ? ?Constipation is defined medically as fewer than three stools per week and severe constipation as less than one stool per week.  Even if you have a regular bowel pattern at home, your normal regimen is likely  to be disrupted due to multiple reasons following surgery.  Combination of anesthesia, postoperative narcotics, change in appetite and fluid intake all can affect your bowels.  ? ?YOU MUST use at least one of the following options; they are listed in order of increasing strength to get the job done.  They are all available over the counter, and you may need to use some, POSSIBLY even all of these options:   ? ?Drink plenty of fluids (prune juice may be helpful) and high fiber foods ?Colace 100 mg by mouth twice a day  ?Senokot for constipation as directed and as needed Dulcolax (bisacodyl), take with full glass of water  ?Miralax (polyethylene glycol) once or twice a day as needed. ? ?If you have tried all these things and are unable to have a bowel movement in the first 3-4 days after surgery call either your surgeon or your primary doctor.   ? ?If you experience loose stools or diarrhea, hold the medications until you stool forms back up.  If your symptoms do not get better within 1 week or if they get worse, check with your doctor.  If you experience "the worst abdominal pain ever" or develop nausea or vomiting, please contact the office immediately for further recommendations for treatment. ? ? ?ITCHING:  If you experience itching with your medications, try taking only a single pain pill, or even half a pain pill at a time.  You can also use Benadryl over the counter  for itching or also to help with sleep.  ? ?TED HOSE STOCKINGS:  Use stockings on both legs until for at least 2 weeks or as directed by physician office. They may be removed at night for sleeping. ? ?MEDICATIONS:  See your medication summary on the ?After Visit Summary? that nursing will review with you.  You may have some home medications which will be placed on hold until you complete the course of blood thinner medication.  It is important for you to complete the blood thinner medication as prescribed. ? ?PRECAUTIONS:  If you experience chest  pain or shortness of breath - call 911 immediately for transfer to the hospital emergency department.  ? ?If you develop a fever greater that 101 F, purulent drainage from wound, increased redness or drainage from wound, foul odor from the wound/dressing, or calf pain - CONTACT YOUR SURGEON.   ?                                                ?FOLLOW-UP APPOINTMENTS:  If you do not already have a post-op appointment, please call the office for an appointment to be seen by your surgeon.  Guidelines for how soon to be seen are listed in your ?After Visit Summary?, but are typically between 1-4 weeks after surgery. ? ?OTHER INSTRUCTIONS:  ? ? ? ?MAKE SURE YOU:  ?Understand these instructions.  ?Get help right away if you are not doing well or get worse.  ? ? ?Thank you for letting us be a part of your medical care team.  It is a privilege we respect greatly.  We hope these instructions will help you stay on track for a fast and full recovery!  ? ?

## 2021-11-21 NOTE — Assessment & Plan Note (Signed)
-   Monitor in AM ?

## 2021-11-21 NOTE — TOC Progression Note (Signed)
Transition of Care (TOC) - Progression Note  ? ? ?Patient Details  ?Name: Darren Castillo ?MRN: [id number] ?Date of Birth: [birth year] ? ?Transition of Care (TOC) CM/SW Contact  ?Conception Oms, RN ?Phone Number: ?11/21/2021, 10:17 AM ? ?Clinical Narrative:    ?Met with the patient at the bedside to discuss DC planning and needs ?He would like to go to [facility], PASSR obtained, Bedsearch sent Fl2 completed ? ? ?  ?  ? ?Expected Discharge Plan and Services ?  ?  ?  ?  ?  ?                ?  ?  ?  ?  ?  ?  ?  ?  ?  ?  ? ? ?Social Determinants of Health (SDOH) Interventions ?  ? ?Readmission Risk Interventions ?No flowsheet data found. ? ?

## 2021-11-21 NOTE — Progress Notes (Signed)
Physical Therapy Treatment ?Patient Details ?Name: Darren Castillo ?MRN: [id number] ?DOB: [birth year] ?Today's Date: 11/21/2021 ? ? ?History of Present Illness Pt is a 71 yo male with medical history significant for nicotine dependence and possible undiagnosed COPD who presented to the ED s/p fall while walking his dog resulting in R hip pain.  Pt diagnosed with right intertrochanteric hip fracture and is now s/p IM nailing. ? ?  ?PT Comments  ? ? Pt was pleasant and motivated to participate during the session and put forth good effort throughout. Pt required min A to manage his RLE during sup to/from sit and once in standing was able to come to standing from an elevated surface with only min A and cues for hand placement.  Pt was able to amb a max of 8 feet this session with some SOB noted while on 2LO2/min and with SpO2 down to 88% after amb compared to 94% at rest.  Pt's SpO2 increased quickly back to the low 90s once returned to sitting.  Pt ambulation remains labored and antalgic with pt having difficulty advancing his LLE even with heavy lean on the RW with BUEs. Pt will benefit from PT services in a SNF setting upon discharge to safely address deficits listed in patient problem list for decreased caregiver assistance and eventual return to PLOF. ? ?   ?Recommendations for follow up therapy are one component of a multi-disciplinary discharge planning process, led by the attending physician.  Recommendations may be updated based on patient status, additional functional criteria and insurance authorization. ? ?Follow Up Recommendations ? Skilled nursing-short term rehab (<3 hours/day) ?  ?  ?Assistance Recommended at Discharge Frequent or constant Supervision/Assistance  ?Patient can return home with the following Two people to help with walking and/or transfers;Two people to help with bathing/dressing/bathroom;Assistance with cooking/housework;Direct supervision/assist for medications management ?  ?Equipment  Recommendations ? BSC/3in1;Other (comment) (TBD at next venue of care)  ?  ?Recommendations for Other Services   ? ? ?  ?Precautions / Restrictions Precautions ?Precautions: Fall ?Restrictions ?Weight Bearing Restrictions: Yes ?RLE Weight Bearing: Weight bearing as tolerated  ?  ? ?Mobility ? Bed Mobility ?Overal bed mobility: Needs Assistance ?Bed Mobility: Supine to Sit, Sit to Supine ?  ?  ?Supine to sit: Min assist ?Sit to supine: Min assist ?  ?General bed mobility comments: Min A to manage the RLE ?  ? ?Transfers ?Overall transfer level: Needs assistance ?Equipment used: Rolling walker (2 wheels) ?Transfers: Sit to/from Stand, Bed to chair/wheelchair/BSC ?Sit to Stand: Min assist ?  ?  ?  ?  ?  ?General transfer comment: Min A to come to full upright position with mod verbal cues for hand placement ?  ? ?Ambulation/Gait ?Ambulation/Gait assistance: Min guard ?Gait Distance (Feet): 8 Feet ?Assistive device: Rolling walker (2 wheels) ?Gait Pattern/deviations: Step-to pattern, Antalgic, Decreased stance time - right, Trunk flexed, Shuffle ?Gait velocity: decreased ?  ?  ?General Gait Details: Pt was able to amb a max of 8 feet with antalgic step-to pattern and mod verbal cues for sequencing.  Pt had difficulty advancing the LLE with extensive BUE support on the RW to assist; SpO2 on 2LO2/min 88% after amb compared to 94% at rest ? ? ?Stairs ?  ?  ?  ?  ?  ? ? ?Wheelchair Mobility ?  ? ?Modified Rankin (Stroke Patients Only) ?  ? ? ?  ?Balance Overall balance assessment: Needs assistance ?Sitting-balance support: Feet supported, Bilateral upper extremity supported ?Sitting balance-Leahy Scale: Good ?  ?  ?  Standing balance support: During functional activity, Reliant on assistive device for balance, Bilateral upper extremity supported ?Standing balance-Leahy Scale: Fair ?  ?  ?  ?  ?  ?  ?  ?  ?  ?  ?  ?  ?  ? ?  ?Cognition Arousal/Alertness: Awake/alert ?Behavior During Therapy: Flat affect ?Overall Cognitive  Status: Within Functional Limits for tasks assessed ?  ?  ?  ?  ?  ?  ?  ?  ?  ?  ?  ?  ?  ?  ?  ?  ?  ?  ?  ? ?  ?Exercises Total Joint Exercises ?Ankle Circles/Pumps: AROM, Strengthening, Both, 10 reps ?Quad Sets: 10 reps, Both, Strengthening ?Gluteal Sets: Strengthening, Both, 10 reps ?Hip ABduction/ADduction: AAROM, Both, Right, 10 reps ?Straight Leg Raises: AAROM, Both, 10 reps, Right ?Long Arc Quad: AROM, Strengthening, Both, 10 reps ?Knee Flexion: AROM, Strengthening, Both, 10 reps ?Marching in Standing: AROM, Strengthening, Right, 5 reps, Standing ?Other Exercises ?Other Exercises: HEP review for BLE APs, QS, and GS x 10 each 5-6x/day ? ?  ?General Comments   ?  ?  ? ?Pertinent Vitals/Pain Pain Assessment ?Pain Assessment: 0-10 ?Pain Score: 6  ?Pain Location: R hip ?Pain Descriptors / Indicators: Aching, Discomfort, Guarding ?Pain Intervention(s): Premedicated before session, Monitored during session, Repositioned  ? ? ?Home Living Family/patient expects to be discharged to:: Private residence ?Living Arrangements: Alone ?Available Help at Discharge: Family;Available PRN/intermittently ?Type of Home: House ?Home Access: Stairs to enter ?Entrance Stairs-Rails: Right;Left;Can reach both ?Entrance Stairs-Number of Steps: 6 ?  ?Home Layout: One level ?Home Equipment: Agricultural consultant (2 wheels) ?Additional Comments: Pt reports if returning home he would go to stay with sister in law who has level entry and 1 story home. She has standard height toilet. Pt would need to enter his home for supplies prior to staying with SIL who works part time during the day.  ?  ?Prior Function    ?  ?  ?   ? ?PT Goals (current goals can now be found in the care plan section) Acute Rehab PT Goals ?Patient Stated Goal: To get up and walk ?PT Goal Formulation: With patient ?Time For Goal Achievement: 12/04/21 ?Potential to Achieve Goals: Good ?Progress towards PT goals: Progressing toward goals ? ?  ?Frequency ? ? ? BID ? ? ? ?  ?PT  Plan Current plan remains appropriate  ? ? ?Co-evaluation   ?  ?  ?  ?  ? ?  ?AM-PAC PT "6 Clicks" Mobility   ?Outcome Measure ? Help needed turning from your back to your side while in a flat bed without using bedrails?: A Little ?Help needed moving from lying on your back to sitting on the side of a flat bed without using bedrails?: A Little ?Help needed moving to and from a bed to a chair (including a wheelchair)?: A Little ?Help needed standing up from a chair using your arms (e.g., wheelchair or bedside chair)?: A Little ?Help needed to walk in hospital room?: A Lot ?Help needed climbing 3-5 steps with a railing? : Total ?6 Click Score: 15 ? ?  ?End of Session Equipment Utilized During Treatment: Gait belt ?Activity Tolerance: Patient limited by pain ?Patient left: in bed;with call bell/phone within reach;with bed alarm set;with SCD's reapplied ?Nurse Communication: Mobility status;Weight bearing status ?PT Visit Diagnosis: Unsteadiness on feet (R26.81);Other abnormalities of gait and mobility (R26.89);Muscle weakness (generalized) (M62.81);Pain ?Pain - Right/Left: Right ?Pain - part of  body: Hip ?  ? ? ?Time: 7062-3762 ?PT Time Calculation (min) (ACUTE ONLY): 23 min ? ?Charges:  $Gait Training: 8-22 mins ?$Therapeutic Exercise: 8-22 mins          ?          ?D. Elly Modena PT, DPT ?11/21/21, 4:54 PM ? ? ?

## 2021-11-21 NOTE — Progress Notes (Signed)
Progress Note  Patient: Darren Castillo YHO:[id number] DOB: [birth year]  DOA: 11/19/2021  DOS: 11/21/2021    Brief hospital course: Oral Remache is a 71 y.o. male with a history of tobacco use who presented to the ED 3/5 after being pulled down stairs while walking his dog, noting immediate right hip pain. In the ED VSS. Hgb 11.9g/dl, WBC 20U, XR confirmed right intertrochanteric femoral fracture. He was given duonebs for wheezing and admitted. Underwent intertrochanteric IM nail placement 3/6 by Dr. Allena Katz for right hip fracture. Disposition to SNF is pending.  Assessment and Plan: * Closed right hip fracture, initial encounter (HCC) - s/p IM intertrochanteric nail 3/6 by Dr. Allena Katz.  - Postoperative lovenox x4 weeks for VTE ppx, pain control as ordered.  - PT/OT formal evaluations > CSW consulted for SNF.  COPD with acute exacerbation (HCC) No prior formal diagnosis of COPD, but had wheezing in ED. This has since resolved. Long term nicotine smoking of course predisposes to COPD, though no significant hyperinflation noted, in addition to no acute findings, on CXR.  - Continue inhaled bronchodilators. No indication for steroids currently. - Recommend formal PFTs after discharge.  Tobacco use disorder - Cessation counseling provided  Subjective: No complaints. Pain is controlled. Eager to start rehab.  Objective: Vitals:   11/21/21 0000 11/21/21 0518 11/21/21 0731 11/21/21 1126  BP: 119/75 134/73 107/70 107/70  Pulse: 90 86 90 93  Resp: 16 18 16 16   Temp: 99.1 F (37.3 C) 97.9 F (36.6 C) 97.6 F (36.4 C) 98 F (36.7 C)  TempSrc:      SpO2: 100% 100% 98% 100%  Weight:      Height:       Gen: 71 y.o. male in no distress Pulm: Nonlabored breathing room air. Clear. CV: Regular rate and rhythm. No murmur, rub, or gallop. No JVD, no dependent edema. GI: Abdomen soft, non-tender, non-distended, with normoactive bowel sounds.  Ext: Warm, no deformities Skin: No rashes, lesions or  ulcers on visualized skin. Surgical site dressing c/d/I, soft compartment. Neuro: Alert and oriented. No focal neurological deficits. Psych: Judgement and insight appear fair. Mood euthymic & affect congruent. Behavior is appropriate.    Data Personally reviewed: CBC: Recent Labs  Lab 11/19/21 2008 11/21/21 0323  WBC 14.4* 7.8  NEUTROABS 12.2*  --   HGB 11.9* 9.2*  HCT 36.1* 27.5*  MCV 91.6 91.7  PLT 264 189   Basic Metabolic Panel: Recent Labs  Lab 11/19/21 2008 11/21/21 0323  NA 137 135  K 3.8 3.7  CL 101 101  CO2 27 27  GLUCOSE 123* 104*  BUN 17 20  CREATININE 0.71 0.65  CALCIUM 8.4* 8.0*     CT HEAD WO CONTRAST (01/21/22)  Result Date: 11/19/2021 CLINICAL DATA:  Head trauma, moderate-severe Got pulled down stairs by dog, fall down steps. EXAM: CT HEAD WITHOUT CONTRAST TECHNIQUE: Contiguous axial images were obtained from the base of the skull through the vertex without intravenous contrast. RADIATION DOSE REDUCTION: This exam was performed according to the departmental dose-optimization program which includes automated exposure control, adjustment of the mA and/or kV according to patient size and/or use of iterative reconstruction technique. COMPARISON:  None. FINDINGS: Brain: No intracranial hemorrhage, mass effect, or midline shift. Age related atrophy. No hydrocephalus. The basilar cisterns are patent. There is moderate periventricular and deep white matter hypodensity typical of chronic small vessel ischemia. 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 No evidence of territorial infarct or acute ischemia. No  extra-axial or intracranial fluid collection. Vascular: Atherosclerosis of skullbase vasculature without hyperdense vessel or abnormal calcification. Skull: No fracture or focal lesion. Sinuses/Orbits: Paranasal sinuses and mastoid air cells are clear. The visualized orbits are unremarkable. Other: None. IMPRESSION: 1. No acute intracranial  abnormality. No skull fracture. 2. Age related atrophy and chronic small vessel ischemia. 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 Electronically Signed   By: Narda Rutherford M.D.   On: 11/19/2021 21:26   CT Cervical Spine Wo Contrast  Result Date: 11/19/2021 CLINICAL DATA:  Neck trauma (Age >= 65y) Pulled down stairs by dog, fall down steps. EXAM: CT CERVICAL SPINE WITHOUT CONTRAST TECHNIQUE: Multidetector CT imaging of the cervical spine was performed without intravenous contrast. Multiplanar CT image reconstructions were also generated. RADIATION DOSE REDUCTION: This exam was performed according to the departmental dose-optimization program which includes automated exposure control, adjustment of the mA and/or kV according to patient size and/or use of iterative reconstruction technique. COMPARISON:  None. FINDINGS: Alignment: No traumatic subluxation. There is trace retrolisthesis of C5 on C6 that is degenerative. Skull base and vertebrae: No acute fracture. Vertebral body heights are maintained. The dens and skull base are intact. Small benign appearing lucency within C6 vertebral body. Soft tissues and spinal canal: No prevertebral fluid or swelling. No visible canal hematoma. Disc levels: Diffuse degenerative disc disease with multilevel facet hypertrophy. There is multilevel neural foraminal narrowing. Mild spinal canal narrowing at C5-C6. Upper chest: Emphysema.  No acute findings. Other: Carotid calcifications. IMPRESSION: Multilevel degenerative disc disease and facet hypertrophy. No acute fracture or traumatic subluxation. Electronically Signed   By: Narda Rutherford M.D.   On: 11/19/2021 21:41   DG Chest Portable 1 View  Result Date: 11/19/2021 CLINICAL DATA:  Recent fall while walking dog with chest pain, initial encounter EXAM: PORTABLE CHEST 1 VIEW COMPARISON:  None. FINDINGS: Cardiac shadow is within normal limits. The lungs are well aerated bilaterally. No  focal infiltrate or effusion is seen. No pneumothorax is noted. No discrete rib fracture is noted. Old healed rib fracture is noted on the right. Mild deformity of the proximal right humerus is noted likely related to prior fracture and healing. IMPRESSION: No acute abnormality noted. Electronically Signed   By: Alcide Clever M.D.   On: 11/19/2021 21:13   DG C-Arm 1-60 Min-No Report  Result Date: 11/20/2021 Fluoroscopy was utilized by the requesting physician.  No radiographic interpretation.   DG HIP UNILAT WITH PELVIS 2-3 VIEWS RIGHT  Result Date: 11/20/2021 CLINICAL DATA:  Intramedullary nail fixation of right femur fracture EXAM: DG HIP (WITH OR WITHOUT PELVIS) 2-3V RIGHT COMPARISON:  Pelvis radiographs dated 1 day prior FINDINGS: Three C-arm fluoroscopic images were obtained intraoperatively and submitted for post operative interpretation. The patient is status post intramedullary nail and screw fixation of the proximal femoral fracture. Alignment is improved compared to the preoperative images. Fluoro time 1 minutes 7 seconds. Please see the performing provider's procedural report for further detail. IMPRESSION: Status post intramedullary nail and screw fixation of the proximal right femoral fracture with improved alignment. Electronically Signed   By: Lesia Hausen M.D.   On: 11/20/2021 14:44   DG Hip Unilat W or Wo Pelvis 2-3 Views Right  Result Date: 11/19/2021 CLINICAL DATA:  Recent fall while walking dog with right hip pain, initial encounter EXAM: DG HIP (WITH OR WITHOUT PELVIS) 3V RIGHT COMPARISON:  None. FINDINGS: Comminuted intratrochanteric fracture is noted with impaction and angulation at the fracture site. Femoral  head is well seated. The pelvic ring appears intact. Degenerative changes of the left hip joint and lumbar spine are noted. No soft tissue abnormality is seen. IMPRESSION: Right intratrochanteric femoral fracture. Electronically Signed   By: Alcide Clever M.D.   On: 11/19/2021  21:14    SARS-CoV-2 PCR (3/5): Negative Influenza A/B: Negative  Family Communication: None at bedside  Disposition: Status is: Inpatient Remains inpatient appropriate because: Unsafe DC Planned Discharge Destination:  Pending postoperative therapy evaluations, anticipate SNF.  Tyrone Nine, MD 11/21/2021 1:10 PM Page by [email or web address]

## 2021-11-22 LAB — BASIC METABOLIC PANEL
Anion gap: 8 (ref 5–15)
BUN: 20 mg/dL (ref 8–23)
CO2: 25 mmol/L (ref 22–32)
Calcium: 8.3 mg/dL — ABNORMAL LOW (ref 8.9–10.3)
Chloride: 104 mmol/L (ref 98–111)
Creatinine, Ser: 0.69 mg/dL (ref 0.61–1.24)
GFR, Estimated: >60 mL/min (ref >60)
Glucose, Bld: 98 mg/dL (ref 70–99)
Potassium: 3.9 mmol/L (ref 3.5–5.1)
Sodium: 137 mmol/L (ref 135–145)

## 2021-11-22 LAB — RESP PANEL BY RT-PCR (FLU A&B, COVID) ARPGX2
Influenza A by PCR: NEGATIVE
Influenza B by PCR: NEGATIVE
SARS Coronavirus 2 by RT PCR: NEGATIVE

## 2021-11-22 LAB — CBC
HCT: 25.6 % — ABNORMAL LOW (ref 39.0–52.0)
Hemoglobin: 8.6 g/dL — ABNORMAL LOW (ref 13.0–17.0)
MCH: 30.4 pg (ref 26.0–34.0)
MCHC: 33.6 g/dL (ref 30.0–36.0)
MCV: 90.5 fL (ref 80.0–100.0)
Platelets: 183 10*3/uL (ref 150–400)
RBC: 2.83 MIL/uL — ABNORMAL LOW (ref 4.22–5.81)
RDW: 14.6 % (ref 11.5–15.5)
WBC: 7.3 10*3/uL (ref 4.0–10.5)
nRBC: 0 % (ref 0.0–0.2)

## 2021-11-22 MED ORDER — DOCUSATE SODIUM 100 MG PO CAPS: 100.0000 mg | ORAL_CAPSULE | Freq: Two times a day (BID) | ORAL | 0 refills | Status: AC

## 2021-11-22 MED ORDER — ENSURE ENLIVE PO LIQD: 237.0000 mL | Freq: Two times a day (BID) | ORAL | 12 refills | Status: AC

## 2021-11-22 MED ORDER — BISACODYL 10 MG RE SUPP
10.0000 mg | Freq: Once | RECTAL | Status: AC
  Administered 2021-11-22: 10 mg via RECTAL
  Filled 2021-11-22: qty 1

## 2021-11-22 MED ORDER — ADULT MULTIVITAMIN W/MINERALS CH: 1.0000 | ORAL_TABLET | Freq: Every day | ORAL | 0 refills | Status: AC

## 2021-11-22 NOTE — Discharge Summary (Signed)
Physician Discharge Summary   Patient: Darren Castillo MRN: [id number] DOB: Sep 26, 1950  Admit date:     11/19/2021  Discharge date: 11/22/21  Discharge Physician: Fritzi Mandes   PCP: Pcp, No   Recommendations at discharge:   follow-up orthopedic surgery in 1 to 2 weeks  Discharge Diagnoses: close right hip fracture status post mechanical fall COPD with history of tobacco abuse  Hospital Course: Darren Castillo is a 71 y.o. male with a history of tobacco use who presented to the ED 3/5 after being pulled down stairs while walking his dog, noting immediate right hip pain. In the ED VSS. Hgb 11.9 g/dl, WBC 14k, XR confirmed right intertrochanteric femoral fracture. He was given duonebs for wheezing and admitted. Underwent intertrochanteric IM nail placement 3/6 by Dr. Posey Pronto for right hip fracture.  Closed right hip fracture, initial encounter ([facility]) - s/p IM intertrochanteric nail 3/6 by Dr. Posey Pronto.  - Postoperative lovenox x4 weeks for VTE ppx, pain control as ordered.  - PT/OT formal evaluations > CSW consulted for SNF.   COPD with acute exacerbation (HCC) -No prior formal diagnosis of COPD, but had wheezing in ED. This has since resolved. Long term nicotine smoking of course predisposes to COPD, though no significant hyperinflation noted, in addition to no acute findings, on CXR.  -Continue inhaled bronchodilators. No indication for steroids currently. - Recommend formal PFTs after discharge.   Tobacco use disorder - Cessation counseling provided       Consultants: Orthopedic Dr Posey Pronto Procedures performed: right hip IM nailing  Disposition: Skilled nursing facility Diet recommendation:  Discharge Diet Orders (From admission, onward)     Start     Ordered   11/22/21 0000  Diet - low sodium heart healthy        11/22/21 1109           Regular diet DISCHARGE MEDICATION: Allergies as of 11/22/2021   No Known Allergies      Medication List     STOP taking these  medications    metoprolol tartrate 25 MG tablet Commonly known as: LOPRESSOR       TAKE these medications    docusate sodium 100 MG capsule Commonly known as: COLACE Take 1 capsule (100 mg total) by mouth 2 (two) times daily.   enoxaparin 40 MG/0.4ML injection Commonly known as: LOVENOX Inject 0.4 mLs (40 mg total) into the skin daily for 14 days.   feeding supplement Liqd Take 237 mLs by mouth 2 (two) times daily between meals.   multivitamin with minerals Tabs tablet Take 1 tablet by mouth daily. Start taking on: November 23, 2021   oxyCODONE 5 MG immediate release tablet Commonly known as: Oxy IR/ROXICODONE Take 0.5-1 tablets (2.5-5 mg total) by mouth every 4 (four) hours as needed for moderate pain (pain score 4-6).   traMADol 50 MG tablet Commonly known as: ULTRAM Take 1 tablet (50 mg total) by mouth every 6 (six) hours as needed for moderate pain.               Discharge Care Instructions  (From admission, onward)           Start     Ordered   11/22/21 0000  Discharge wound care:       Comments: 11/20/21 1842    Reinforce dressing  Until discontinued   11/22/21 1109            Contact information for follow-up providers     Reche Dixon, PA-C Follow up in  2 week(s).   Specialty: Orthopedic Surgery Why: For staple removal and x-rays of the right hip Contact information: [address] [place] [address] [phone]              Contact information for after-discharge care     [facility] Preferred SNF .   Service: Skilled Nursing Contact information: [place] [phone]                    Discharge Exam: Danley Danker Weights   11/19/21 2003 11/20/21 1245  Weight: 63.5 kg 63.5 kg     Condition at discharge: fair  The results of significant diagnostics from  this hospitalization (including imaging, microbiology, ancillary and laboratory) are listed below for reference.   Imaging Studies: CT HEAD WO CONTRAST (5MM)  Result Date: 11/19/2021 CLINICAL DATA:  Head trauma, moderate-severe Got pulled down stairs by dog, fall down steps. EXAM: CT HEAD WITHOUT CONTRAST TECHNIQUE: Contiguous axial images were obtained from the base of the skull through the vertex without intravenous contrast. RADIATION DOSE REDUCTION: This exam was performed according to the departmental dose-optimization program which includes automated exposure control, adjustment of the mA and/or kV according to patient size and/or use of iterative reconstruction technique. COMPARISON:  None. FINDINGS: Brain: No intracranial hemorrhage, mass effect, or midline shift. Age related atrophy. No hydrocephalus. The basilar cisterns are patent. There is moderate periventricular and deep white matter hypodensity typical of chronic small vessel ischemia. 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 No evidence of territorial infarct or acute ischemia. No extra-axial or intracranial fluid collection. Vascular: Atherosclerosis of skullbase vasculature without hyperdense vessel or abnormal calcification. Skull: No fracture or focal lesion. Sinuses/Orbits: Paranasal sinuses and mastoid air cells are clear. The visualized orbits are unremarkable. Other: None. IMPRESSION: 1. No acute intracranial abnormality. No skull fracture. 2. Age related atrophy and chronic small vessel ischemia. 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 Electronically Signed   By: Keith Rake M.D.   On: 11/19/2021 21:26   CT Cervical Spine Wo Contrast  Result Date: 11/19/2021 CLINICAL DATA:  Neck trauma (Age >= 65y) Pulled down stairs by dog, fall down steps. EXAM: CT CERVICAL SPINE WITHOUT CONTRAST TECHNIQUE: Multidetector CT imaging of the cervical spine was performed without  intravenous contrast. Multiplanar CT image reconstructions were also generated. RADIATION DOSE REDUCTION: This exam was performed according to the departmental dose-optimization program which includes automated exposure control, adjustment of the mA and/or kV according to patient size and/or use of iterative reconstruction technique. COMPARISON:  None. FINDINGS: Alignment: No traumatic subluxation. There is trace retrolisthesis of C5 on C6 that is degenerative. Skull base and vertebrae: No acute fracture. Vertebral body heights are maintained. The dens and skull base are intact. Small benign appearing lucency within C6 vertebral body. Soft tissues and spinal canal: No prevertebral fluid or swelling. No visible canal hematoma. Disc levels: Diffuse degenerative disc disease with multilevel facet hypertrophy. There is multilevel neural foraminal narrowing. Mild spinal canal narrowing at C5-C6. Upper chest: Emphysema.  No acute findings. Other: Carotid calcifications. IMPRESSION: Multilevel degenerative disc disease and facet hypertrophy. No acute fracture or traumatic subluxation. Electronically Signed   By: Keith Rake M.D.   On: 11/19/2021 21:41   DG Chest Portable 1 View  Result Date: 11/19/2021 CLINICAL DATA:  Recent fall while  walking dog with chest pain, initial encounter EXAM: PORTABLE CHEST 1 VIEW COMPARISON:  None. FINDINGS: Cardiac shadow is within normal limits. The lungs are well aerated bilaterally. No focal infiltrate or effusion is seen. No pneumothorax is noted. No discrete rib fracture is noted. Old healed rib fracture is noted on the right. Mild deformity of the proximal right humerus is noted likely related to prior fracture and healing. IMPRESSION: No acute abnormality noted. Electronically Signed   By: Inez Catalina M.D.   On: 11/19/2021 21:13   DG C-Arm 1-60 Min-No Report  Result Date: 11/20/2021 Fluoroscopy was utilized by the requesting physician.  No radiographic interpretation.    DG HIP UNILAT WITH PELVIS 2-3 VIEWS RIGHT  Result Date: 11/20/2021 CLINICAL DATA:  Intramedullary nail fixation of right femur fracture EXAM: DG HIP (WITH OR WITHOUT PELVIS) 2-3V RIGHT COMPARISON:  Pelvis radiographs dated 1 day prior FINDINGS: Three C-arm fluoroscopic images were obtained intraoperatively and submitted for post operative interpretation. The patient is status post intramedullary nail and screw fixation of the proximal femoral fracture. Alignment is improved compared to the preoperative images. Fluoro time 1 minutes 7 seconds. Please see the performing provider's procedural report for further detail. IMPRESSION: Status post intramedullary nail and screw fixation of the proximal right femoral fracture with improved alignment. Electronically Signed   By: Valetta Mole M.D.   On: 11/20/2021 14:44   DG Hip Unilat W or Wo Pelvis 2-3 Views Right  Result Date: 11/19/2021 CLINICAL DATA:  Recent fall while walking dog with right hip pain, initial encounter EXAM: DG HIP (WITH OR WITHOUT PELVIS) 3V RIGHT COMPARISON:  None. FINDINGS: Comminuted intratrochanteric fracture is noted with impaction and angulation at the fracture site. Femoral head is well seated. The pelvic ring appears intact. Degenerative changes of the left hip joint and lumbar spine are noted. No soft tissue abnormality is seen. IMPRESSION: Right intratrochanteric femoral fracture. Electronically Signed   By: Inez Catalina M.D.   On: 11/19/2021 21:14    Microbiology: Results for orders placed or performed during the hospital encounter of 11/19/21  Resp Panel by RT-PCR (Flu A&B, Covid) Nasopharyngeal Swab     Status: None   Collection Time: 11/19/21  8:08 PM   Specimen: Nasopharyngeal Swab; Nasopharyngeal(NP) swabs in vial transport medium  Result Value Ref Range Status   SARS Coronavirus 2 by RT PCR NEGATIVE NEGATIVE Final    Comment: (NOTE) SARS-CoV-2 target nucleic acids are NOT DETECTED.  The SARS-CoV-2 RNA is generally  detectable in upper respiratory specimens during the acute phase of infection. The lowest concentration of SARS-CoV-2 viral copies this assay can detect is 138 copies/mL. A negative result does not preclude SARS-Cov-2 infection and should not be used as the sole basis for treatment or other patient management decisions. A negative result may occur with  improper specimen collection/handling, submission of specimen other than nasopharyngeal swab, presence of viral mutation(s) within the areas targeted by this assay, and inadequate number of viral copies(<138 copies/mL). A negative result must be combined with clinical observations, patient history, and epidemiological information. The expected result is Negative.  Fact Sheet for Patients:  EntrepreneurPulse.com.au  Fact Sheet for Healthcare Providers:  IncredibleEmployment.be  This test is no t yet approved or cleared by the [place] FDA and  has been authorized for detection and/or diagnosis of SARS-CoV-2 by FDA under an Emergency Use Authorization (EUA). This EUA will remain  in effect (meaning this test can be used) for the duration of the COVID-19 declaration under  Section 564(b)(1) of the Act, 21 U.S.C.section 360bbb-3(b)(1), unless the authorization is terminated  or revoked sooner.       Influenza A by PCR NEGATIVE NEGATIVE Final   Influenza B by PCR NEGATIVE NEGATIVE Final    Comment: (NOTE) The Xpert Xpress SARS-CoV-2/FLU/RSV plus assay is intended as an aid in the diagnosis of influenza from Nasopharyngeal swab specimens and should not be used as a sole basis for treatment. Nasal washings and aspirates are unacceptable for Xpert Xpress SARS-CoV-2/FLU/RSV testing.  Fact Sheet for Patients: EntrepreneurPulse.com.au  Fact Sheet for Healthcare Providers: IncredibleEmployment.be  This test is not yet approved or cleared by the [place] FDA  and has been authorized for detection and/or diagnosis of SARS-CoV-2 by FDA under an Emergency Use Authorization (EUA). This EUA will remain in effect (meaning this test can be used) for the duration of the COVID-19 declaration under Section 564(b)(1) of the Act, 21 U.S.C. section 360bbb-3(b)(1), unless the authorization is terminated or revoked.  Performed at [facility], [address] [place], [place] [address]     Labs: CBC: Recent Labs  Lab 11/19/21 2008 11/21/21 0323 11/22/21 0335  WBC 14.4* 7.8 7.3  NEUTROABS 12.2*  --   --   HGB 11.9* 9.2* 8.6*  HCT 36.1* 27.5* 25.6*  MCV 91.6 91.7 90.5  PLT 264 189 [id number]   Basic Metabolic Panel: Recent Labs  Lab 11/19/21 2008 11/21/21 0323 11/22/21 0335  NA 137 135 137  K 3.8 3.7 3.9  CL 101 101 104  CO2 27 27 25   GLUCOSE 123* 104* 98  BUN 17 20 20   CREATININE 0.71 0.65 0.69  CALCIUM 8.4* 8.0* 8.3*   Liver Function Tests: No results for input(s): AST, ALT, ALKPHOS, BILITOT, PROT, ALBUMIN in the last 168 hours. CBG: No results for input(s): GLUCAP in the last 168 hours.  Discharge time spent: greater than 30 minutes.  Signed: Fritzi Mandes, MD [facility] 11/22/2021

## 2021-11-22 NOTE — TOC Progression Note (Signed)
Transition of Care (TOC) - Progression Note  ? ? ?Patient Details  ?Name: Darren Castillo ?MRN: [id number] ?Date of Birth: 04-Oct-1950 ? ?Transition of Care (TOC) CM/SW Contact  ?Conception Oms, RN ?Phone Number: ?11/22/2021, 9:51 AM ? ?Clinical Narrative:   Met with the patient and reviewed the bed offer, he accepted the bed offer from offer from [facility] notified Magda Paganini at [facility] ?Called Hassan Rowan the patient's sister at his request to let her know ?I explained the plan will be to go today with EMS to transport ? ? ? ?Expected Discharge Plan: [place] ?Barriers to Discharge: Continued Medical Work up ? ?Expected Discharge Plan and Services ?Expected Discharge Plan: [place] ?  ?  ?  ?  ?                ?  ?  ?  ?  ?  ?  ?  ?  ?  ?  ? ? ?Social Determinants of Health (SDOH) Interventions ?  ? ?Readmission Risk Interventions ?No flowsheet data found. ? ?

## 2021-11-22 NOTE — Care Management Important Message (Signed)
Important Message ? ?Patient Details  ?Name: Darren Castillo ?MRN: [id number] ?Date of Birth: [birth year] ? ? ?Medicare Important Message Given:  Yes ? ? ? ? ?Olegario Messier A Dare Sanger ?11/22/2021, 11:02 AM ?

## 2021-11-22 NOTE — TOC Progression Note (Signed)
Transition of Care (TOC) - Progression Note  ? ? ?Patient Details  ?Name: Darren Castillo ?MRN: [id number] ?Date of Birth: 1951/06/12 ? ?Transition of Care (TOC) CM/SW Contact  ?Marlowe Sax, RN ?Phone Number: ?11/22/2021, 12:02 PM ? ?Clinical Narrative:   Called EMS to transport the patient to [facility] ?He is covid neg ?He is 4th on the list ? ? ? ?Expected Discharge Plan: Skilled Nursing Facility ?Barriers to Discharge: Continued Medical Work up ? ?Expected Discharge Plan and Services ?Expected Discharge Plan: Skilled Nursing Facility ?  ?  ?  ?  ?Expected Discharge Date: 11/22/21               ?  ?  ?  ?  ?  ?  ?  ?  ?  ?  ? ? ?Social Determinants of Health (SDOH) Interventions ?  ? ?Readmission Risk Interventions ?No flowsheet data found. ? ?

## 2021-11-22 NOTE — Progress Notes (Signed)
Report called to Melody at [facility].  ?

## 2021-11-22 NOTE — Progress Notes (Signed)
Physical Therapy Treatment ?Patient Details ?Name: Darren Castillo ?MRN: [id number] ?DOB: 02-09-1951 ?Today's Date: 11/22/2021 ? ? ?History of Present Illness Pt is a 71 yo male with medical history significant for nicotine dependence and possible undiagnosed COPD who presented to the ED s/p fall while walking his dog resulting in R hip pain.  Pt diagnosed with right intertrochanteric hip fracture and is now s/p IM nailing. ? ?  ?PT Comments  ? ? Pt was pleasant and motivated to participate during the session and put forth good effort throughout. ?Pt required no physical assistance with transfers with correct sequencing but initially attempted to stand with hands on the RW and was unable to do so.  Pt increased max amb distance slightly this session from 8 feet yesterday to 10 feet today and was visibly able to advance his LLE with improved clearance.  Pt's SpO2 remained >/= 90% on room air during the session per below and per nursing request pt was left on room air at end of session.  Pt will benefit from PT services in a SNF setting upon discharge to safely address deficits listed in patient problem list for decreased caregiver assistance and eventual return to PLOF. ?   ?Recommendations for follow up therapy are one component of a multi-disciplinary discharge planning process, led by the attending physician.  Recommendations may be updated based on patient status, additional functional criteria and insurance authorization. ? ?Follow Up Recommendations ? Skilled nursing-short term rehab (<3 hours/day) ?  ?  ?Assistance Recommended at Discharge Frequent or constant Supervision/Assistance  ?Patient can return home with the following Two people to help with walking and/or transfers;Two people to help with bathing/dressing/bathroom;Assistance with cooking/housework;Direct supervision/assist for medications management ?  ?Equipment Recommendations ? BSC/3in1;Other (comment)  ?  ?Recommendations for Other Services   ? ? ?   ?Precautions / Restrictions Precautions ?Precautions: Fall ?Restrictions ?Weight Bearing Restrictions: Yes ?RLE Weight Bearing: Weight bearing as tolerated  ?  ? ?Mobility ? Bed Mobility ?  ?  ?  ?  ?  ?  ?  ?General bed mobility comments: NT, pt in recliner ?  ? ?Transfers ?Overall transfer level: Needs assistance ?Equipment used: Rolling walker (2 wheels) ?Transfers: Sit to/from Stand ?Sit to Stand: Min guard ?  ?  ?  ?  ?  ?General transfer comment: Mod verbal cues for sequencing ?  ? ?Ambulation/Gait ?Ambulation/Gait assistance: Min guard ?Gait Distance (Feet): 10 Feet ?Assistive device: Rolling walker (2 wheels) ?Gait Pattern/deviations: Step-to pattern, Antalgic, Decreased stance time - right, Trunk flexed, Shuffle ?Gait velocity: decreased ?  ?  ?General Gait Details: Pt was able to amb a max of 10 feet with antalgic step-to pattern and mod verbal cues for sequencing.  SpO2 on room air 98% at rest and 90% after amb, increased back to 92-93% quickly upon returning to sitting, per nursing request pt left on room air ? ? ?Stairs ?  ?  ?  ?  ?  ? ? ?Wheelchair Mobility ?  ? ?Modified Rankin (Stroke Patients Only) ?  ? ? ?  ?Balance Overall balance assessment: Needs assistance ?Sitting-balance support: Feet supported, Bilateral upper extremity supported ?Sitting balance-Leahy Scale: Good ?  ?  ?Standing balance support: During functional activity, Reliant on assistive device for balance, Bilateral upper extremity supported ?Standing balance-Leahy Scale: Fair ?  ?  ?  ?  ?  ?  ?  ?  ?  ?  ?  ?  ?  ? ?  ?Cognition Arousal/Alertness: Awake/alert ?Behavior  During Therapy: Flat affect ?Overall Cognitive Status: Within Functional Limits for tasks assessed ?  ?  ?  ?  ?  ?  ?  ?  ?  ?  ?  ?  ?  ?  ?  ?  ?  ?  ?  ? ?  ?Exercises Total Joint Exercises ?Ankle Circles/Pumps: AROM, Strengthening, Both, 10 reps ?Quad Sets: 10 reps, Both, Strengthening ?Gluteal Sets: Strengthening, Both, 10 reps ?Long Arc Quad: AROM,  Strengthening, Both, 10 reps ?Knee Flexion: AROM, Strengthening, Both, 10 reps ?Marching in Standing: AROM, Strengthening, Right, 5 reps, Standing ? ?  ?General Comments   ?  ?  ? ?Pertinent Vitals/Pain Pain Assessment ?Pain Assessment: 0-10 ?Pain Score: 2  ?Pain Location: R hip ?Pain Descriptors / Indicators: Aching, Discomfort, Guarding ?Pain Intervention(s): Repositioned, Premedicated before session, Monitored during session  ? ? ?Home Living   ?  ?  ?  ?  ?  ?  ?  ?  ?  ?   ?  ?Prior Function    ?  ?  ?   ? ?PT Goals (current goals can now be found in the care plan section) Progress towards PT goals: Progressing toward goals ? ?  ?Frequency ? ? ? BID ? ? ? ?  ?PT Plan Current plan remains appropriate  ? ? ?Co-evaluation   ?  ?  ?  ?  ? ?  ?AM-PAC PT "6 Clicks" Mobility   ?Outcome Measure ? Help needed turning from your back to your side while in a flat bed without using bedrails?: A Little ?Help needed moving from lying on your back to sitting on the side of a flat bed without using bedrails?: A Little ?Help needed moving to and from a bed to a chair (including a wheelchair)?: A Little ?Help needed standing up from a chair using your arms (e.g., wheelchair or bedside chair)?: A Little ?Help needed to walk in hospital room?: A Lot ?Help needed climbing 3-5 steps with a railing? : Total ?6 Click Score: 15 ? ?  ?End of Session Equipment Utilized During Treatment: Gait belt ?Activity Tolerance: Patient limited by pain ?Patient left: with call bell/phone within reach;in chair;with chair alarm set ?Nurse Communication: Mobility status;Weight bearing status;Other (comment) (ok for break from SCDs per nursing) ?PT Visit Diagnosis: Unsteadiness on feet (R26.81);Other abnormalities of gait and mobility (R26.89);Muscle weakness (generalized) (M62.81);Pain ?Pain - Right/Left: Right ?Pain - part of body: Hip ?  ? ? ?Time: 5093-2671 ?PT Time Calculation (min) (ACUTE ONLY): 24 min ? ?Charges:  $Gait Training: 8-22  mins ?$Therapeutic Exercise: 8-22 mins  ? ?D. Elly Modena PT, DPT ?11/22/21, 2:38 PM ?         ?          ? ? ? ?

## 2021-11-22 NOTE — Progress Notes (Signed)
?  Subjective: ?2 Days Post-Op Procedure(s) (LRB): ?INTRAMEDULLARY (IM) NAIL INTERTROCHANTRIC (Right) ?Patient reports pain as mild.   ?Patient is well, and has had no acute complaints or problems ?Plan is to go Rehab after hospital stay. ?Negative for chest pain and shortness of breath ?Fever: no ?Gastrointestinal: Negative for nausea and vomiting ? ?Objective: ?Vital signs in last 24 hours: ?Temp:  [97.6 ?F (36.4 ?C)-98.8 ?F (37.1 ?C)] 97.8 ?F (36.6 ?C) (03/08 0403) ?Pulse Rate:  [90-110] 101 (03/08 0403) ?Resp:  [16-20] 20 (03/08 0403) ?BP: (107-123)/(68-78) 118/68 (03/08 0403) ?SpO2:  [94 %-100 %] 96 % (03/08 0403) ? ?Intake/Output from previous day: ? ?Intake/Output Summary (Last 24 hours) at 11/22/2021 0706 ?Last data filed at 11/21/2021 1905 ?Gross per 24 hour  ?Intake --  ?Output 250 ml  ?Net -250 ml  ?  ?Intake/Output this shift: ?No intake/output data recorded. ? ?Labs: ?Recent Labs  ?  11/19/21 ?2008 11/21/21 ?0323 11/22/21 ?0335  ?HGB 11.9* 9.2* 8.6*  ? ?Recent Labs  ?  11/21/21 ?0323 11/22/21 ?0335  ?WBC 7.8 7.3  ?RBC 3.00* 2.83*  ?HCT 27.5* 25.6*  ?PLT 189 183  ? ?Recent Labs  ?  11/21/21 ?0323 11/22/21 ?0335  ?NA 135 137  ?K 3.7 3.9  ?CL 101 104  ?CO2 27 25  ?BUN 20 20  ?CREATININE 0.65 0.69  ?GLUCOSE 104* 98  ?CALCIUM 8.0* 8.3*  ? ?No results for input(s): LABPT, INR in the last 72 hours. ? ? ?EXAM ?General - Patient is Alert and Oriented ?Extremity - Neurovascular intact ?Sensation intact distally ?Dorsiflexion/Plantar flexion intact ?Compartment soft ?Dressing/Incision - clean, dry, no drainage ?Motor Function - intact, moving foot and toes well on exam.  Ambulated 8 feet with physical therapy. ? ?History reviewed. No pertinent past medical history. ? ?Assessment/Plan: ?2 Days Post-Op Procedure(s) (LRB): ?INTRAMEDULLARY (IM) NAIL INTERTROCHANTRIC (Right) ?Principal Problem: ?  Closed right hip fracture, initial encounter (HCC) ?Active Problems: ?  Accidental fall ?  COPD with acute exacerbation (HCC) ?   Tobacco use disorder ?  Acute postoperative anemia due to expected blood loss ? ?Estimated body mass index is 22.6 kg/m? as calculated from the following: ?  Height as of this encounter: 5\' 6"  (1.676 m). ?  Weight as of this encounter: 63.5 kg. ?Advance diet ?Up with therapy ?D/C IV fluids ? ?Discharge planning.  Plan to go to rehab.  Possible discharge today. ? ?DVT Prophylaxis - Lovenox, Foot Pumps, and TED hose ?Weight-Bearing as tolerated to right leg ? ? , PA-C ?Orthopaedic Surgery ?11/22/2021, 7:06 AM ? ?

## 2021-11-22 NOTE — Progress Notes (Signed)
PIV removed, cath tip intact. Patient changed into disposable gown. All belongings collected and returned to patient. Transported to [facility] via EMS. All scripts placed in DC packet. ?

## 2023-02-18 IMAGING — CT CT CERVICAL SPINE W/O CM
3 of 5 series · 13 of 35 positions shown, 14 images · non-contrast
Comparison: None.

CLINICAL DATA: Neck trauma (Age >= 65y)

Pulled down stairs by dog, fall down steps.
EXAM:
CT CERVICAL SPINE WITHOUT CONTRAST
TECHNIQUE: Multidetector CT imaging of the cervical spine was performed without
intravenous contrast. Multiplanar CT image reconstructions were also
generated.
RADIATION DOSE REDUCTION: This exam was performed according to the
departmental dose-optimization program which includes automated
exposure control, adjustment of the mA and/or kV according to
patient size and/or use of iterative reconstruction technique.

[Series 6: sagittal bone · sagittal · 0.29mm/px · 6 of 70 slices shown]
[im 12/70  bone]
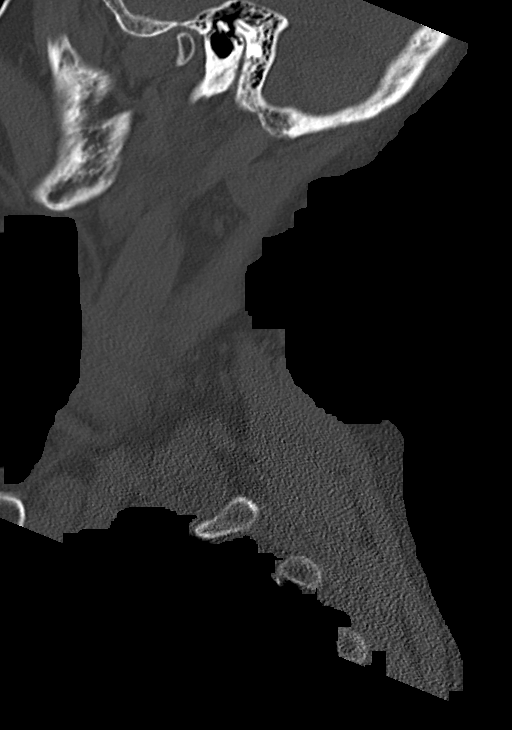
[im 24/70  bone]
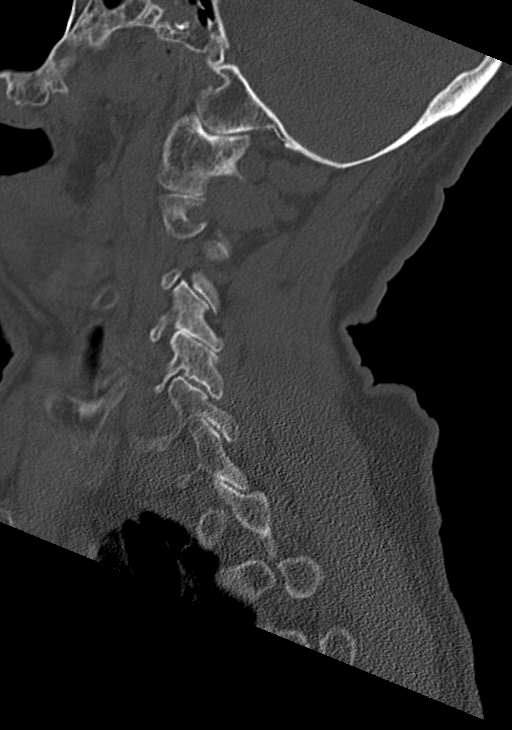
[im 31/70  soft-tissue]
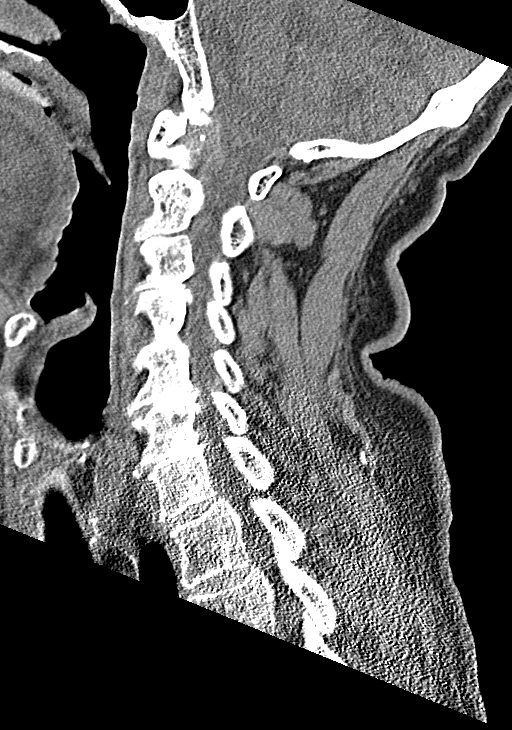
[im 35/70  bone]
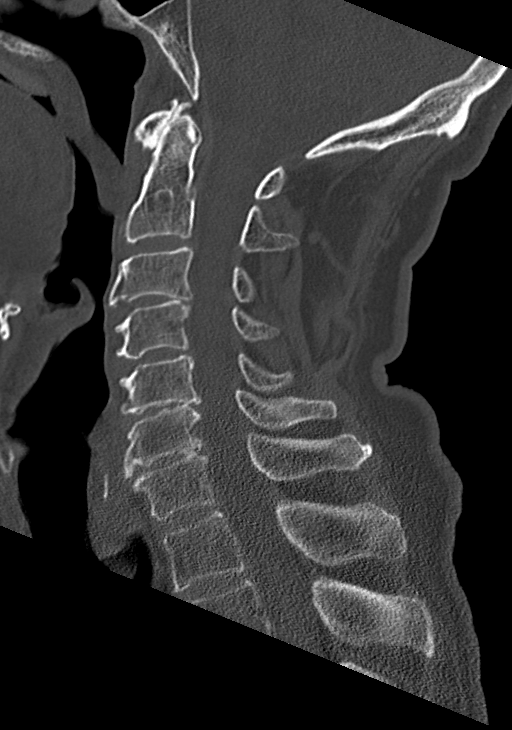
[im 47/70  bone]
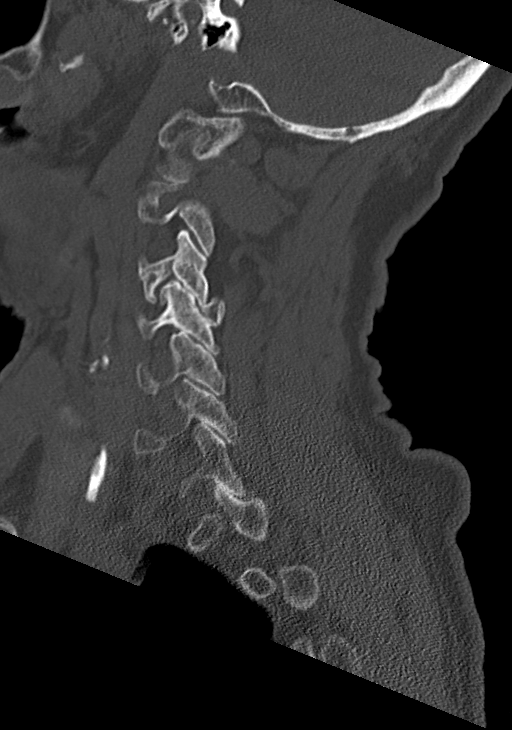
[im 58/70  bone]
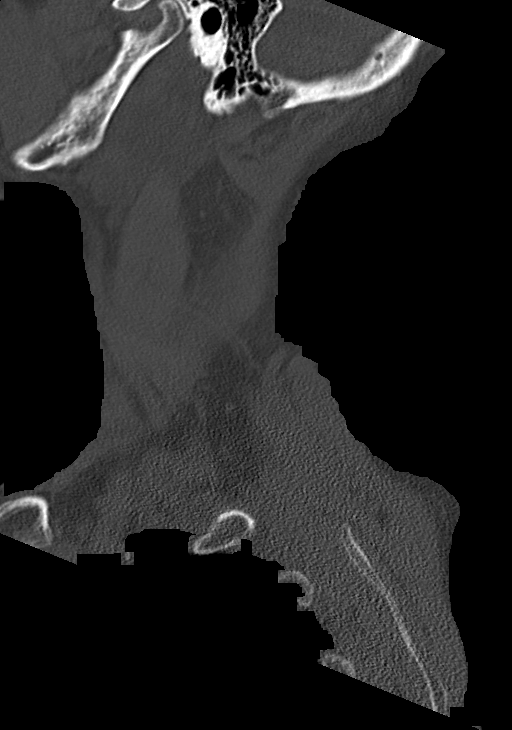

[Series 8: orthogonal bone · axial · 0.27mm/px · z∈[-327,-188]mm · 4 of 106 slices shown, 5 images]
[im 16/106  soft-tissue]
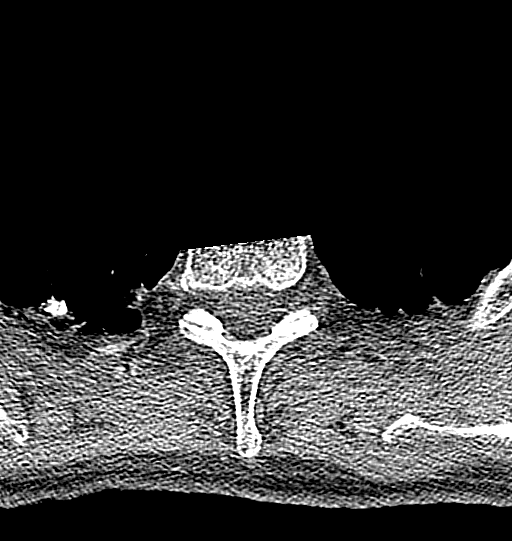
[im 16/106  bone]
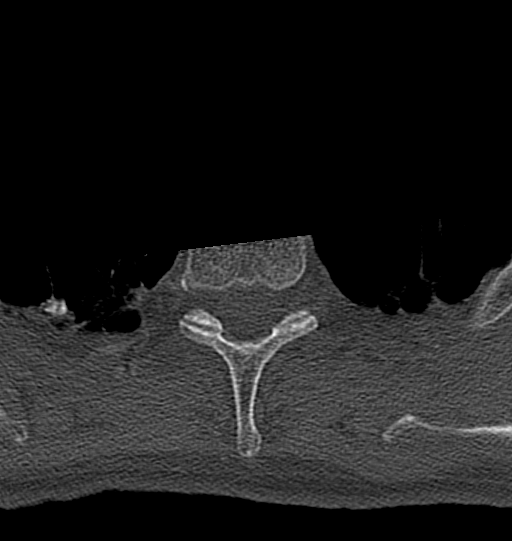
[im 46/106  bone]
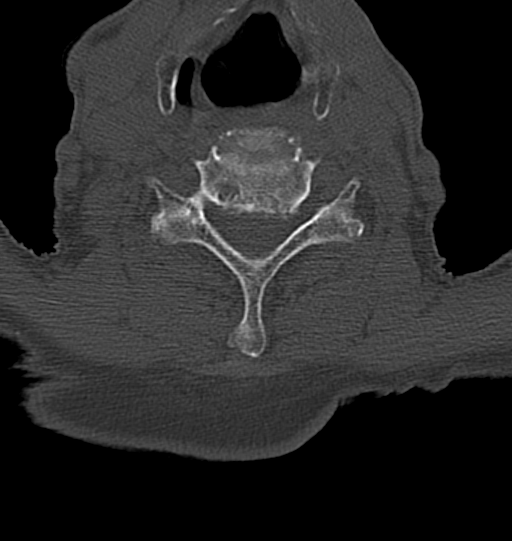
[im 61/106  bone]
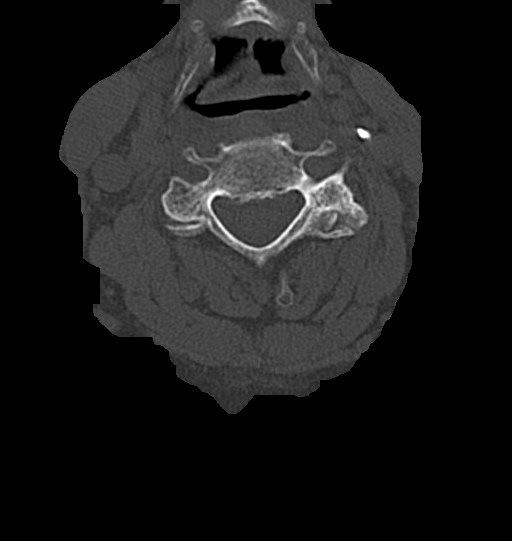
[im 91/106  bone]
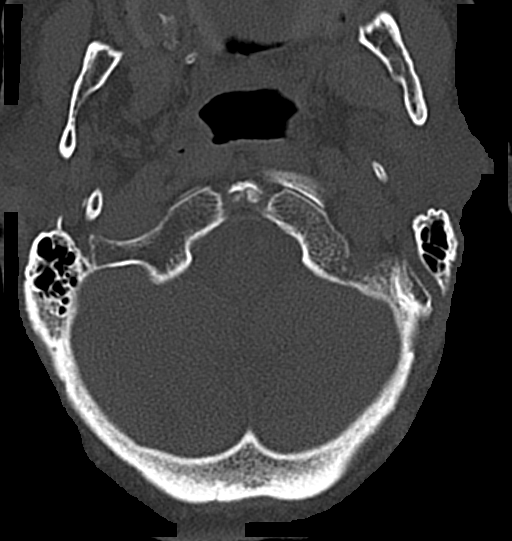

[Series 10: cor bone · coronal · 0.33mm/px · 3 of 189 slices shown]
[im 38/189  bone]
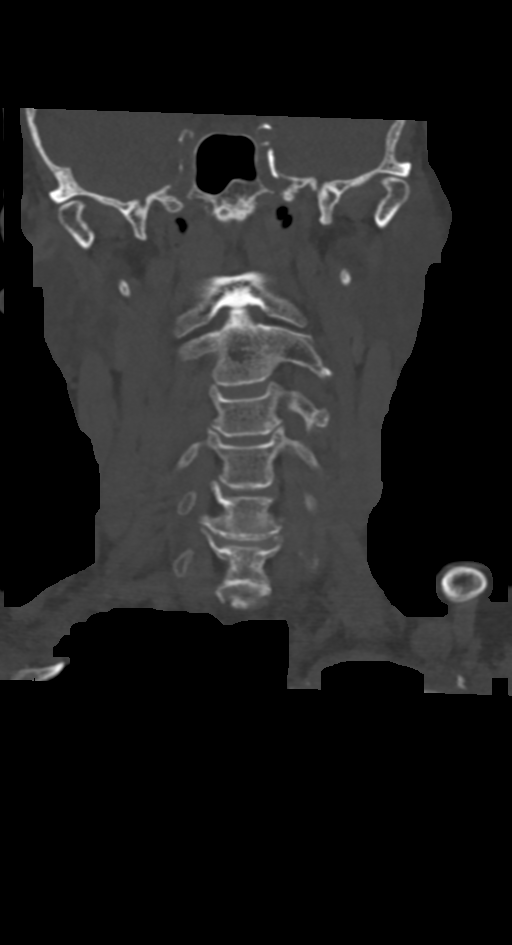
[im 76/189  bone]
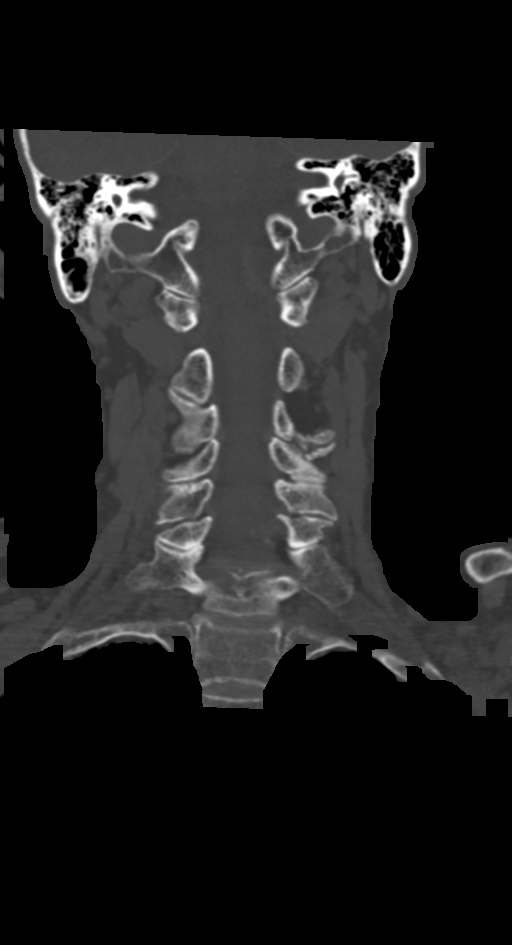
[im 113/189  bone]
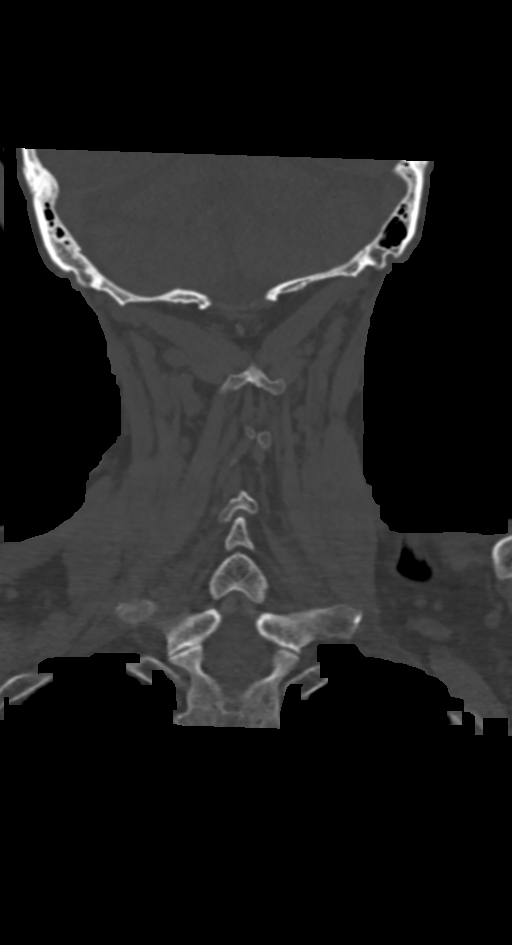

[13 of 35 positions shown; findings below may reference images not displayed]

FINDINGS: Alignment: No traumatic subluxation. There is trace retrolisthesis
of C5 on C6 that is degenerative.

Skull base and vertebrae: No acute fracture. Vertebral body heights
are maintained. The dens and skull base are intact. Small benign
appearing lucency within C6 vertebral body.

Soft tissues and spinal canal: No prevertebral fluid or swelling. No
visible canal hematoma.

Disc levels: Diffuse degenerative disc disease with multilevel facet
hypertrophy. There is multilevel neural foraminal narrowing. Mild
spinal canal narrowing at C5-C6.

Upper chest: Emphysema.  No acute findings.

Other: Carotid calcifications.
IMPRESSION: Multilevel degenerative disc disease and facet hypertrophy. No acute
fracture or traumatic subluxation.

## 2023-02-19 IMAGING — XA DG HIP (WITH OR WITHOUT PELVIS) 2-3V*R*
1 series · 3 of 3 positions shown · non-contrast
Comparison: Pelvis radiographs dated 1 day prior

CLINICAL DATA: Intramedullary nail fixation of right femur fracture

EXAM:
DG HIP (WITH OR WITHOUT PELVIS) 2-3V RIGHT

[Series 1: dg x-ray · 0.20mm/px · 3 of 3 slices shown]
[im 1/3]
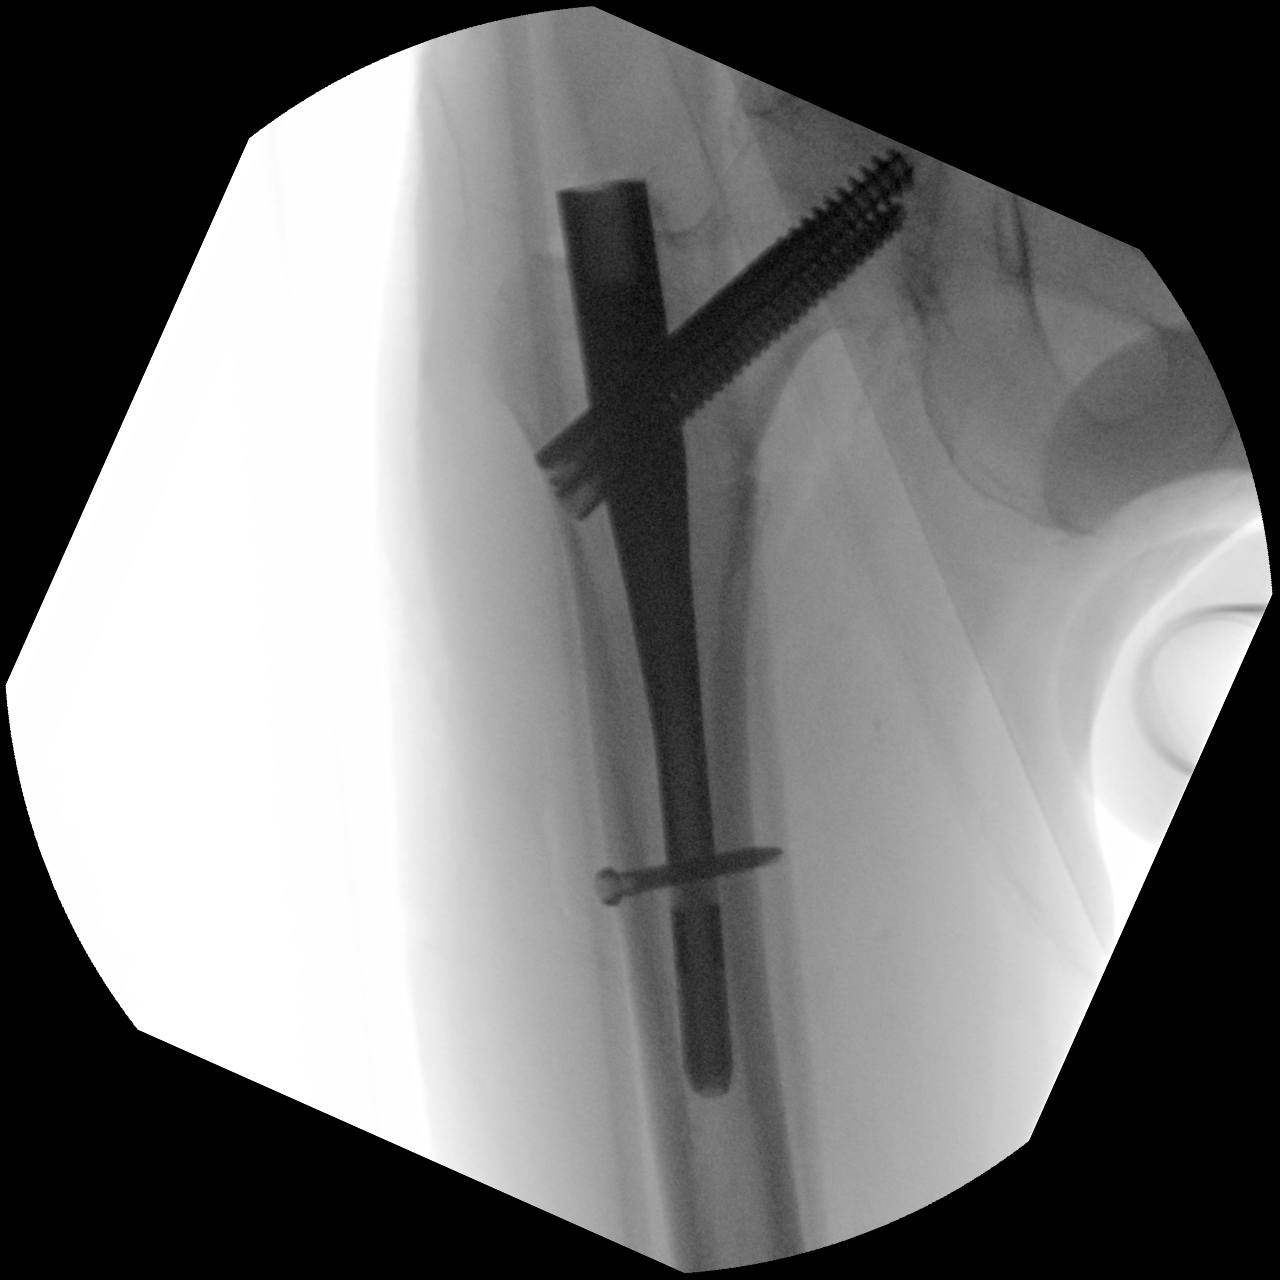
[im 2/3]
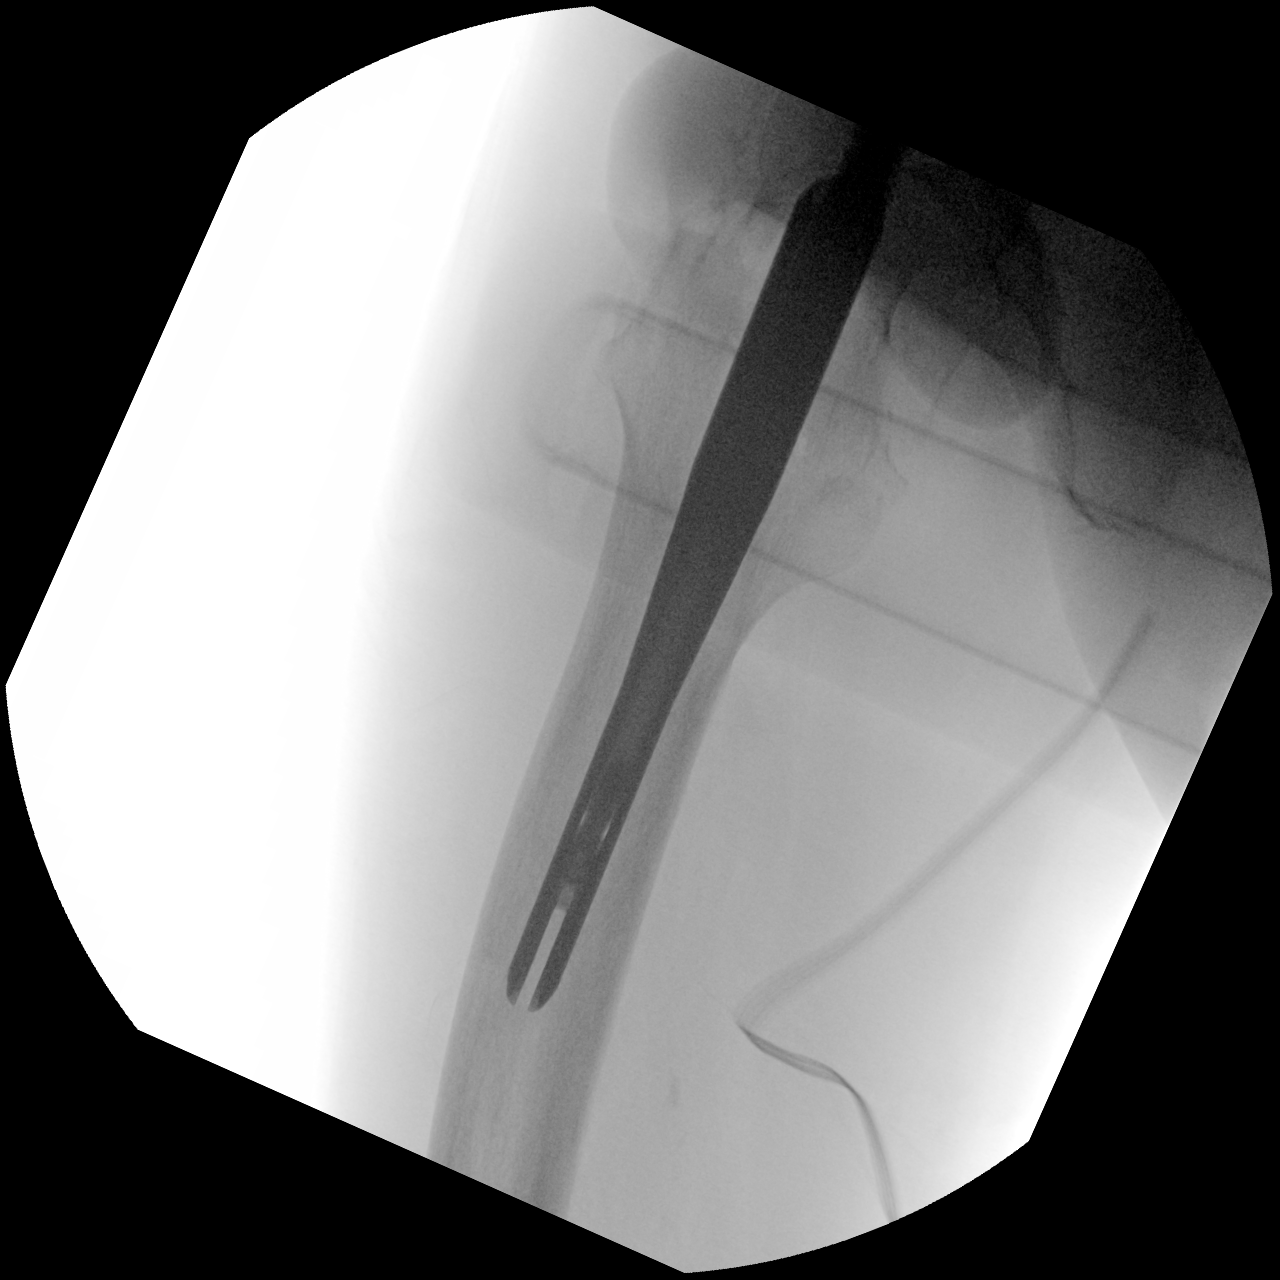
[im 3/3]
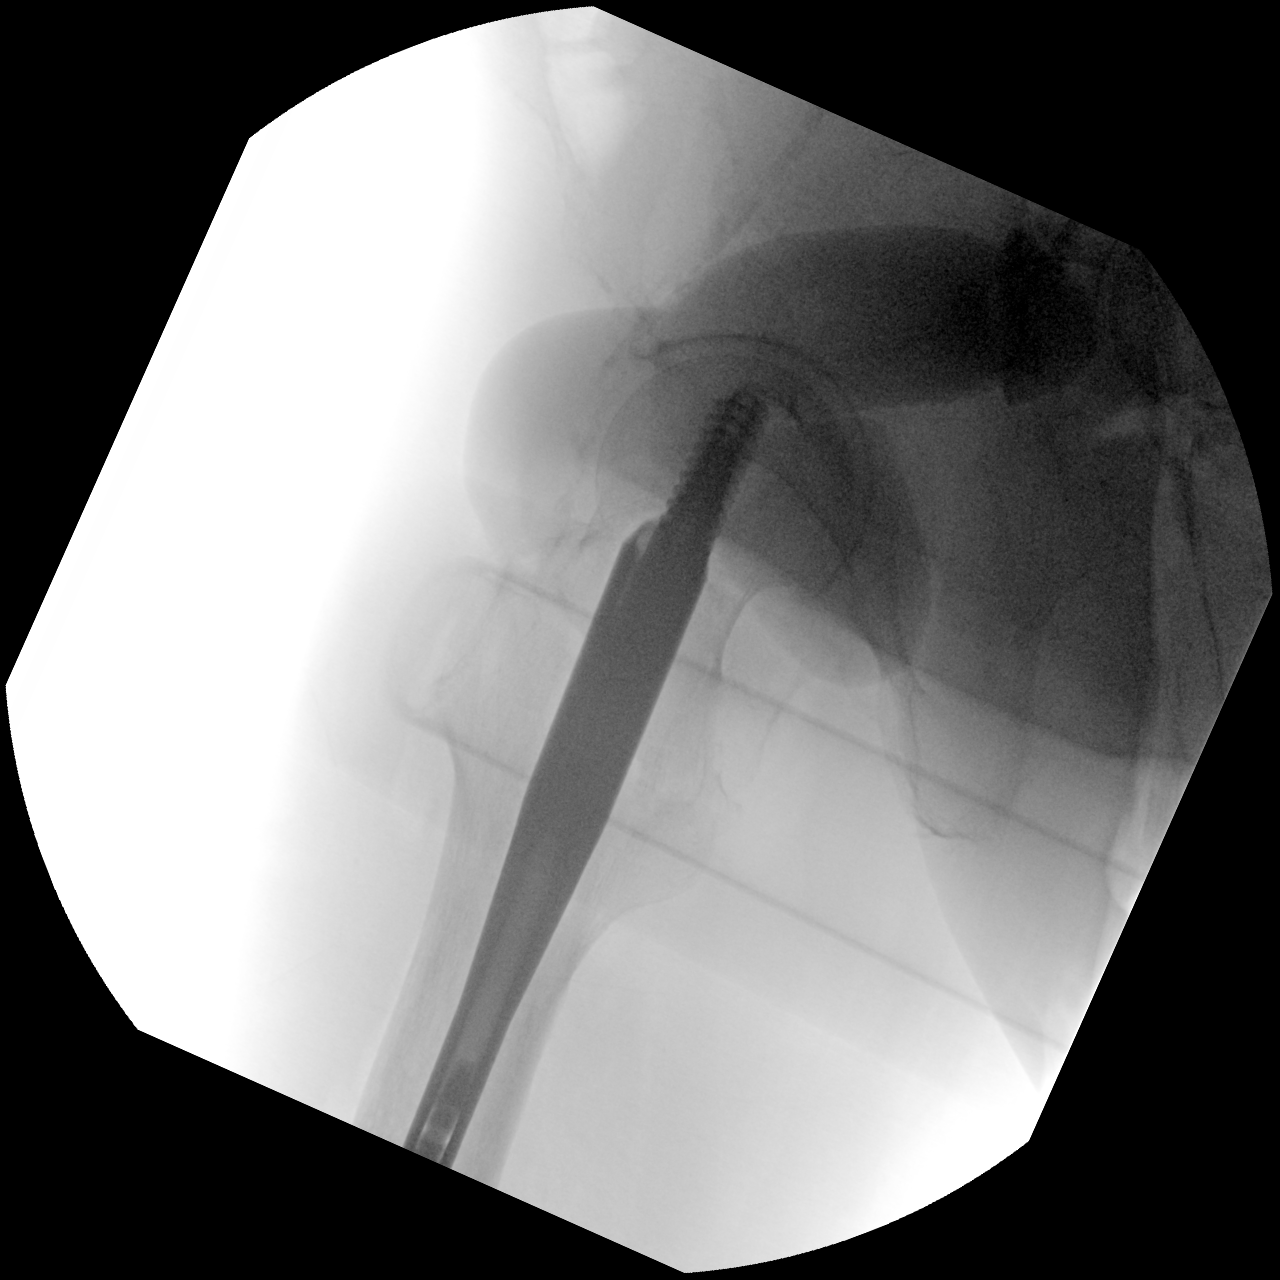

[3 of 3 positions shown; findings below may reference images not displayed]

FINDINGS: Three C-arm fluoroscopic images were obtained intraoperatively and
submitted for post operative interpretation. The patient is status
post intramedullary nail and screw fixation of the proximal femoral
fracture. Alignment is improved compared to the preoperative images.
Fluoro time 1 minutes 7 seconds. Please see the performing
provider's procedural report for further detail.
IMPRESSION: Status post intramedullary nail and screw fixation of the proximal
right femoral fracture with improved alignment.
